# Patient Record
Sex: Male | Born: 1985 | Race: White | Hispanic: No | Smoking: Never smoker
Health system: Southern US, Community
[De-identification: ages and names within clinical notes are randomized; demographics above are authoritative.]

## PROBLEM LIST (undated history)

## (undated) DIAGNOSIS — M549 Dorsalgia, unspecified: Secondary | ICD-10-CM

## (undated) DIAGNOSIS — K219 Gastro-esophageal reflux disease without esophagitis: Secondary | ICD-10-CM

## (undated) DIAGNOSIS — M2550 Pain in unspecified joint: Secondary | ICD-10-CM

## (undated) DIAGNOSIS — E785 Hyperlipidemia, unspecified: Secondary | ICD-10-CM

## (undated) DIAGNOSIS — E7800 Pure hypercholesterolemia, unspecified: Secondary | ICD-10-CM

## (undated) DIAGNOSIS — R7303 Prediabetes: Secondary | ICD-10-CM

## (undated) DIAGNOSIS — T7840XA Allergy, unspecified, initial encounter: Secondary | ICD-10-CM

## (undated) DIAGNOSIS — F419 Anxiety disorder, unspecified: Secondary | ICD-10-CM

## (undated) DIAGNOSIS — I10 Essential (primary) hypertension: Secondary | ICD-10-CM

## (undated) DIAGNOSIS — E669 Obesity, unspecified: Secondary | ICD-10-CM

## (undated) DIAGNOSIS — R0602 Shortness of breath: Secondary | ICD-10-CM

## (undated) DIAGNOSIS — F988 Other specified behavioral and emotional disorders with onset usually occurring in childhood and adolescence: Secondary | ICD-10-CM

## (undated) HISTORY — DX: Pure hypercholesterolemia, unspecified: E78.00

## (undated) HISTORY — DX: Essential (primary) hypertension: I10

## (undated) HISTORY — DX: Other specified behavioral and emotional disorders with onset usually occurring in childhood and adolescence: F98.8

## (undated) HISTORY — DX: Allergy, unspecified, initial encounter: T78.40XA

## (undated) HISTORY — DX: Anxiety disorder, unspecified: F41.9

## (undated) HISTORY — DX: Dorsalgia, unspecified: M54.9

## (undated) HISTORY — PX: KNEE SURGERY: SHX244

## (undated) HISTORY — DX: Gastro-esophageal reflux disease without esophagitis: K21.9

## (undated) HISTORY — DX: Obesity, unspecified: E66.9

## (undated) HISTORY — DX: Prediabetes: R73.03

## (undated) HISTORY — PX: WRIST FRACTURE SURGERY: SHX121

## (undated) HISTORY — DX: Hyperlipidemia, unspecified: E78.5

## (undated) HISTORY — DX: Pain in unspecified joint: M25.50

## (undated) HISTORY — DX: Shortness of breath: R06.02

---
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
## 2003-07-23 ENCOUNTER — Emergency Department (HOSPITAL_COMMUNITY): Admission: EM | Admit: 2003-07-23 | Discharge: 2003-07-23 | Payer: Self-pay | Admitting: Emergency Medicine

## 2003-08-17 ENCOUNTER — Ambulatory Visit (HOSPITAL_COMMUNITY): Admission: RE | Admit: 2003-08-17 | Discharge: 2003-08-18 | Payer: Self-pay | Admitting: Orthopedic Surgery

## 2007-10-10 ENCOUNTER — Encounter: Admission: RE | Admit: 2007-10-10 | Discharge: 2007-10-10 | Payer: Self-pay | Admitting: Family Medicine

## 2010-02-23 ENCOUNTER — Encounter: Admission: RE | Admit: 2010-02-23 | Discharge: 2010-02-23 | Payer: Self-pay | Admitting: Family Medicine

## 2011-02-09 NOTE — Op Note (Signed)
NAME:  Alexander Solomon, Alexander Solomon NO.:  [id number]   MEDICAL RECORD NO.:  [id number]                   PATIENT TYPE:  OIB   LOCATION:  5017                                 FACILITY:  [facility]   PHYSICIAN:  Burnard Bunting, M.D.                 DATE OF BIRTH:  Dec 04, 1985   DATE OF PROCEDURE:  08/17/2003  DATE OF DISCHARGE:  08/18/2003                                 OPERATIVE REPORT   PREOPERATIVE DIAGNOSIS:  Right knee osteochondral fracture and patellar  dislocation.   POSTOPERATIVE DIAGNOSIS:  Right knee osteochondral fracture and patellar  dislocation.   PROCEDURES:  1. Right knee diagnostic and operative arthroscopy.  2. Right knee removal of loose bodies x3, each one 6 x 6 mm.  3. Intra-articular debridement of synovitis.  4. Open medial patellofemoral ligament repair.  5. Arthroscopic lateral retinacular release.   SURGEON:  Burnard Bunting, M.D.   ASSISTANT:  __________   ANESTHESIA:  General endotracheal plus postoperative femoral nerve block.   ESTIMATED BLOOD LOSS:  25 mL.   DRAINS:  None.   TOURNIQUET TIME:  Two hours at 300 mmHg.   OPERATIVE FINDINGS:  1. Examination under anesthesia:  Significant lateral instability of the     patella, the three quadrants lateral, and one and a half quadrants     medial.     a. Intact anterior cruciate ligament and posterior cruciate ligament.     b. No postoperative rotary instability.     c. Intact medial collateral ligament and lateral collateral ligament.  2. Diagnostic and operative arthroscopy.     a. Osteochondral fracture with loose bodies x3 each measuring 6 x 6 mm.        Loose bodies were located in the lateral gutter and were not on the        weightbearing surface of the articular cartilage.     b. Generalized synovitis in the suprapatellar pouch.     c. Intact anterior cruciate ligament and posterior cruciate ligament.     d. Intact medial compartment.     e. 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   PROCEDURE IN DETAIL:  The patient was brought to the operating room where  general endotracheal anesthesia was induced.  Preoperative IV antibiotics  were administered.  The right leg and foot was prepped using DuraPrep  solution and draped in a sterile manner.  A proximal right thigh tourniquet  was placed  The left was elevated, exsanguinated with the Esmarch wrap, and  the tourniquet was inflated  The anatomy of the knee was identified  including the medial, lateral, and inferior margin of the patella as well as  the patellar tendon and the joint line both on the medial and lateral sides.  Anterior-inferior lateral portal was first established. Diagnostic  arthroscopy  was performed.  Medial compartment was intact with intact and  stable meniscus, intact articular cartilage.  The ACL and PCL was intact.  Lateral compartment showed some early grade 1 chondromalacia along the  lateral tibial plateau.  The patella was noted to have a significant lateral  tilt.  Loose bodies were present in the lateral gutter, and these were  consistent with osteochondral shear fractures from the patellar dislocation.  The medial facet of the patella was relatively spared.  These loose bodies  were removed after creation of an anterior superolateral portal.  Debridement was then performed of inflamed synovitic tissue in the  suprapatellar pouch region.  Excessive bleeding was not created.  Following  removal of the loose bodies, the lateral retinacular release was performed  under arthroscopic visualization.  Following lateral release, the patella  was tilted approximately 60 degrees which was adequate when combined with  the medial reefing procedure.  At this time, the instruments were removed  from their portals which were then closed using 3-0 nylon suture.  Alexander Solomon was  then used to cover the knee.  An incision was made between the medial  aspect  of the patella and the medial epicondyle.  Skin and subcutaneous tissue were  sharply divided.  Branches were divided and protected when possible.  A  sleeve of tissue was then elevated off the medial aspect of the patella  beginning at the expansion of the patellar tendon distally and extending  proximally up to include part of the VMO attachment.  A sleeve of soft  tissue was removed.  Dissection was performed to separate the capsule from  the underlying medial patellofemoral ligament.  This was performed.  The  capsule was torn from its attachment off the medial epicondyle.  The medial  patellofemoral ligament had also been stretched.  The VMO fascia was  repaired back to the intermuscular septum using an inverted interrupted 0  Vicryl suture.  A 5 mm suture anchor was then placed in the medial  epicondyle to provide an anchoring location for both the capsule and the  medial patellofemoral ligament.  Both ends of the sutures were then brought  through the capsule and the epicondylar attachment of the medial  patellofemoral ligament.  Secure anchoring was achieved.  At this time, the  capsule and medial patellofemoral ligament was advanced on the patella using  a combination of #2 Fibrewire suture and Curve Tech device.  An imbrication  of the elongated medial retinacular restraint was achieved.  This suture was  reapproximated to the patella in about 40 degrees of flexion with the  patella well located in the groove.  The patient's knee was taken through a  range of motion, and the patient was found to have full range of motion.  The retinacular repair was reinforced with 0 Vicryl sutures.  The bony  trough was created in the exposed portion of the patellar facet prior to  plication of the medial retinacular restraints.  Following suture fixation,  the tourniquet was released.  Bleeding points encountered were controlled using electrocautery.  Skin and subcutaneous tissue was  then closed using  interrupted inverted 0 Vicryl, 2-0 Vicryl, and a 3-0 Prolene suture.  A  solution of morphine, Marcaine, and clonidine was then injected into the  knee.  The patient was placed in a bulky dressing and knee immobilizer.  He  tolerated the procedure well without immediate complications.  Burnard Bunting, M.D.    GSD/MEDQ  D:  08/18/2003  T:  08/18/2003  Job:  [id number]

## 2015-03-03 ENCOUNTER — Ambulatory Visit: Payer: Self-pay | Admitting: Cardiology

## 2015-05-04 ENCOUNTER — Encounter: Payer: Self-pay | Admitting: Cardiology

## 2015-05-04 ENCOUNTER — Ambulatory Visit (INDEPENDENT_AMBULATORY_CARE_PROVIDER_SITE_OTHER): Payer: PRIVATE HEALTH INSURANCE | Admitting: Cardiology

## 2015-05-04 VITALS — BP 110/72 | HR 76 | Ht 71.0 in | Wt 352.0 lb

## 2015-05-04 DIAGNOSIS — R002 Palpitations: Secondary | ICD-10-CM

## 2015-05-04 DIAGNOSIS — I493 Ventricular premature depolarization: Secondary | ICD-10-CM

## 2015-05-04 DIAGNOSIS — E785 Hyperlipidemia, unspecified: Secondary | ICD-10-CM

## 2015-05-04 DIAGNOSIS — R079 Chest pain, unspecified: Secondary | ICD-10-CM | POA: Diagnosis not present

## 2015-05-04 DIAGNOSIS — I10 Essential (primary) hypertension: Secondary | ICD-10-CM

## 2015-05-04 DIAGNOSIS — R0683 Snoring: Secondary | ICD-10-CM

## 2015-05-04 DIAGNOSIS — G4733 Obstructive sleep apnea (adult) (pediatric): Secondary | ICD-10-CM

## 2015-05-04 DIAGNOSIS — R0602 Shortness of breath: Secondary | ICD-10-CM

## 2015-05-04 DIAGNOSIS — G4719 Other hypersomnia: Secondary | ICD-10-CM

## 2015-05-04 HISTORY — DX: Obstructive sleep apnea (adult) (pediatric): G47.33

## 2015-05-04 HISTORY — DX: Other hypersomnia: G47.19

## 2015-05-04 HISTORY — DX: Ventricular premature depolarization: I49.3

## 2015-05-04 HISTORY — DX: Essential (primary) hypertension: I10

## 2015-05-04 HISTORY — DX: Chest pain, unspecified: R07.9

## 2015-05-04 HISTORY — DX: Hyperlipidemia, unspecified: E78.5

## 2015-05-04 NOTE — Progress Notes (Signed)
Cardiology Office Note   Date:  05/04/2015   ID:  Alexander Solomon, DOB 1986-07-09, MRN [id number]  PCP:  Lupita Raider, MD    Chief Complaint  Patient presents with  . New Evaluation    Palpitations      History of Present Illness: Alexander Solomon is a 30 y.o. male who presents for evaluation of chest fluttering.  He recently saw his PCP for followup of HTN, dyslipidemia and obesity.  He has been having intermittent fluttering on the left side of his chest that he only notices when at rest or lying on his left side.  It lasts and few seconds and spontaneously resolves.  He denies any nausea, LE edema, dizziness or syncope.  EKG showed NSR with PVC in PCP office. He has also been having some episodes of chest discomfort that is very hard for him to describe.  It is in the upper left chest and into his shoulder.  It can last for a few hours a day but is not associated with nausea or diaphoresis.  He is very concerned because his dad died after having 3 open heart surgeries for CAD.  His father was a smoker and drinker.  He has some DOE but is very obese.  He says that he snores and feels tired all day.    Past Medical History  Diagnosis Date  . Hypertension   . Hyperlipidemia   . ADD (attention deficit disorder)   . GERD (gastroesophageal reflux disease)   . Obesity     History reviewed. No pertinent past surgical history.   Current Outpatient Prescriptions  Medication Sig Dispense Refill  . buPROPion (WELLBUTRIN SR) 150 MG 12 hr tablet Take 150 mg by mouth daily.    . cyclobenzaprine (FLEXERIL) 10 MG tablet Take 10 mg by mouth 3 (three) times daily as needed for muscle spasms.    . diclofenac (VOLTAREN) 75 MG EC tablet Take 75 mg by mouth 2 (two) times daily.    Marland Kitchen loratadine-pseudoephedrine (CLARITIN-D 24-HOUR) 10-240 MG per 24 hr tablet Take 1 tablet by mouth daily.    Marland Kitchen losartan-hydrochlorothiazide (HYZAAR) 100-25 MG per tablet Take 1 tablet by mouth daily.      . mometasone (ELOCON) 0.1 % cream Apply 1 application topically 2 (two) times daily as needed (FOR EXCEMA OR RASH PATCHES).    . Multiple Vitamin (MULTIVITAMIN) tablet Take 1 tablet by mouth daily.    . Omega-3 Fatty Acids (FISH OIL) 1200 MG CAPS Take 1,200 mg by mouth daily.    Marland Kitchen omeprazole (PRILOSEC) 40 MG capsule Take 40 mg by mouth daily.    . simvastatin (ZOCOR) 40 MG tablet Take 40 mg by mouth daily.     No current facility-administered medications for this visit.    Allergies:   Review of patient's allergies indicates no known allergies.    Social History:  The patient  reports that he has never smoked. He does not have any smokeless tobacco history on file. He reports that he does not drink alcohol or use illicit drugs.   Family History:  The patient's family history is not on file.    ROS:  Please see the history of present illness.   Otherwise, review of systems are positive for none.   All other systems are reviewed and negative.    PHYSICAL EXAM: VS:  Ht 5\' 11"  (1.803 m)  Wt 352 lb (  159.666 kg)  BMI 49.12 kg/m2 , BMI Body mass index is 49.12 kg/(m^2). GEN: Well nourished, well developed, in no acute distress HEENT: normal Neck: no JVD, carotid bruits, or masses Cardiac: RRR; no murmurs, rubs, or gallops,no edema  Respiratory:  clear to auscultation bilaterally, normal work of breathing GI: soft, nontender, nondistended, + BS MS: no deformity or atrophy Skin: warm and dry, no rash Neuro:  Strength and sensation are intact Psych: euthymic mood, full affect   EKG:  EKG is ordered today. The ekg ordered today demonstrates NSR with no ST changes   Recent Labs: No results found for requested labs within last 365 days.    Lipid Panel No results found for: CHOL, TRIG, HDL, CHOLHDL, VLDL, LDLCALC, LDLDIRECT    Wt Readings from Last 3 Encounters:  05/04/15 352 lb (159.666 kg)        ASSESSMENT AND PLAN:  1.  Palpitations - EKG in PCP office showed a PVC.   I will get a 30 day event monitor to assess further 2.  HTN - controlled on Hyzaar 3.  Obesity 4.  Atypical chest pain with no ischemia on EKG.  He does have risk factors including family history, obesity, HTN and dyslipidemia.  I will get a baseline ETT. 5.  SOB that is most likely due to obesity.  Check 2D echo to assess LVF. 6.  Excessive daytime sleepiness with snoring.  I suspect he has OSA which may be triggering his palpitations.  I will get a home sleep study.   Current medicines are reviewed at length with the patient today.  The patient does not have concerns regarding medicines.  The following changes have been made:  no change  Labs/ tests ordered today: See above Assessment and Plan No orders of the defined types were placed in this encounter.     Disposition:   FU with me PRN pending results of heart monitor  Signed, Quintella Reichert, MD  05/04/2015 9:34 AM    [facility] [address] [place], [place], [place]  [address] Phone: [phone]; Fax: [phone]

## 2015-05-04 NOTE — Patient Instructions (Signed)
Medication Instructions:  Your physician recommends that you continue on your current medications as directed. Please refer to the Current Medication list given to you today.   Labwork: None  Testing/Procedures: Your physician has requested that you have an echocardiogram. Echocardiography is a painless test that uses sound waves to create images of your heart. It provides your doctor with information about the size and shape of your heart and how well your heart's chambers and valves are working. This procedure takes approximately one hour. There are no restrictions for this procedure.   Your physician has requested that you have an exercise tolerance test. For further information please visit [email or web address] Please also follow instruction sheet, as given.  Your physician has recommended that you wear an event monitor. Event monitors are medical devices that record the heart's electrical activity. Doctors most often [place] these monitors to diagnose arrhythmias. Arrhythmias are problems with the speed or rhythm of the heartbeat. The monitor is a small, portable device. You can wear one while you do your normal daily activities. This is usually used to diagnose what is causing palpitations/syncope (passing out).  Dr. Mayford Knife recommends you have a HOME SLEEP STUDY. Bethany, CMA, will call you to schedule this when pre-certification is complete.  Follow-Up: Your physician recommends that you schedule a follow-up appointment AS NEEDED with Dr. Mayford Knife pending your study results.  Any Other Special Instructions Will Be Listed Below (If Applicable).

## 2015-05-13 ENCOUNTER — Telehealth (HOSPITAL_COMMUNITY): Payer: Self-pay

## 2015-05-13 NOTE — Telephone Encounter (Signed)
Encounter complete. 

## 2015-05-16 ENCOUNTER — Ambulatory Visit (HOSPITAL_COMMUNITY): Payer: 59 | Attending: Internal Medicine

## 2015-05-16 ENCOUNTER — Other Ambulatory Visit: Payer: Self-pay

## 2015-05-16 ENCOUNTER — Ambulatory Visit (INDEPENDENT_AMBULATORY_CARE_PROVIDER_SITE_OTHER): Payer: PRIVATE HEALTH INSURANCE

## 2015-05-16 DIAGNOSIS — Z8249 Family history of ischemic heart disease and other diseases of the circulatory system: Secondary | ICD-10-CM | POA: Insufficient documentation

## 2015-05-16 DIAGNOSIS — I10 Essential (primary) hypertension: Secondary | ICD-10-CM | POA: Insufficient documentation

## 2015-05-16 DIAGNOSIS — R002 Palpitations: Secondary | ICD-10-CM | POA: Diagnosis not present

## 2015-05-16 DIAGNOSIS — E785 Hyperlipidemia, unspecified: Secondary | ICD-10-CM | POA: Diagnosis not present

## 2015-05-16 DIAGNOSIS — R0602 Shortness of breath: Secondary | ICD-10-CM | POA: Diagnosis not present

## 2015-05-16 DIAGNOSIS — Z6841 Body Mass Index (BMI) 40.0 and over, adult: Secondary | ICD-10-CM | POA: Diagnosis not present

## 2015-05-18 ENCOUNTER — Ambulatory Visit (HOSPITAL_COMMUNITY)
Admission: RE | Admit: 2015-05-18 | Discharge: 2015-05-18 | Disposition: A | Discharge status: Home / Self Care | Payer: 59 | Source: Ambulatory Visit | Attending: Cardiology | Admitting: Cardiology

## 2015-05-18 DIAGNOSIS — R079 Chest pain, unspecified: Secondary | ICD-10-CM | POA: Diagnosis not present

## 2015-05-18 LAB — EXERCISE TOLERANCE TEST
Estimated workload: 10.3 METS
Exercise duration (min): 8 min
Exercise duration (sec): 22 s
MPHR: 189 {beats}/min
Peak HR: 176 {beats}/min
Percent HR: 93 %
RPE: 16
Rest HR: 85 {beats}/min

## 2015-06-02 ENCOUNTER — Encounter: Payer: Self-pay | Admitting: Cardiology

## 2015-06-02 ENCOUNTER — Telehealth: Payer: Self-pay | Admitting: Cardiology

## 2015-06-02 DIAGNOSIS — G4733 Obstructive sleep apnea (adult) (pediatric): Secondary | ICD-10-CM

## 2015-06-02 NOTE — Telephone Encounter (Signed)
Please let patient know that he has mild OSA with AHI 13/hr.  Given his snoring and excessive daytime sleepiness recommend in lab CPAP titration.  Please set up

## 2015-06-02 NOTE — Telephone Encounter (Signed)
Left message to call back  

## 2015-06-02 NOTE — Telephone Encounter (Signed)
This encounter was created in error - please disregard.

## 2015-06-03 ENCOUNTER — Encounter: Payer: Self-pay | Admitting: *Deleted

## 2015-06-03 NOTE — Addendum Note (Signed)
Addended by: Arcola Jansky on: 06/03/2015 08:44 AM   Modules accepted: Orders

## 2015-06-03 NOTE — Telephone Encounter (Signed)
Patient is aware of results. Okay to proceed.   Would like a call after precert just to know that it is covered, and what his cost might end up being.

## 2015-06-03 NOTE — Telephone Encounter (Signed)
It depends on his plan and benefits how much he would end up being responsible for.  He would need to call his insurance and give them the CPT code 40981 and also that it will be done at [facility] Tax ID [id number].   We will make sure his sleep study is approved if it is required before the services are rendered.

## 2015-06-15 ENCOUNTER — Telehealth: Payer: Self-pay | Admitting: Cardiology

## 2015-06-15 NOTE — Telephone Encounter (Signed)
Home sleep study showed mild OSA.  Given history of palpitations, snoring and excessive daytime sleepiness - please set up for in lab CPAP titration

## 2015-06-16 NOTE — Telephone Encounter (Signed)
Patient is scheduled for Titration 07/06/15

## 2015-07-06 ENCOUNTER — Encounter (HOSPITAL_BASED_OUTPATIENT_CLINIC_OR_DEPARTMENT_OTHER): Payer: PRIVATE HEALTH INSURANCE

## 2015-09-07 ENCOUNTER — Other Ambulatory Visit: Payer: Self-pay | Admitting: Internal Medicine

## 2015-09-07 ENCOUNTER — Ambulatory Visit (INDEPENDENT_AMBULATORY_CARE_PROVIDER_SITE_OTHER): Payer: 59 | Admitting: Internal Medicine

## 2015-09-07 ENCOUNTER — Encounter: Payer: Self-pay | Admitting: Internal Medicine

## 2015-09-07 DIAGNOSIS — J302 Other seasonal allergic rhinitis: Secondary | ICD-10-CM

## 2015-09-07 DIAGNOSIS — F909 Attention-deficit hyperactivity disorder, unspecified type: Secondary | ICD-10-CM

## 2015-09-07 DIAGNOSIS — F988 Other specified behavioral and emotional disorders with onset usually occurring in childhood and adolescence: Secondary | ICD-10-CM

## 2015-09-07 DIAGNOSIS — K219 Gastro-esophageal reflux disease without esophagitis: Secondary | ICD-10-CM

## 2015-09-07 DIAGNOSIS — Z7251 High risk heterosexual behavior: Secondary | ICD-10-CM | POA: Insufficient documentation

## 2015-09-07 DIAGNOSIS — F419 Anxiety disorder, unspecified: Secondary | ICD-10-CM

## 2015-09-07 HISTORY — DX: High risk heterosexual behavior: Z72.51

## 2015-09-07 HISTORY — DX: Other seasonal allergic rhinitis: J30.2

## 2015-09-07 HISTORY — DX: Other specified behavioral and emotional disorders with onset usually occurring in childhood and adolescence: F98.8

## 2015-09-07 HISTORY — DX: Morbid (severe) obesity due to excess calories: E66.01

## 2015-09-07 MED ORDER — EMTRICITABINE-TENOFOVIR DF 200-300 MG PO TABS: 1.0000 | ORAL_TABLET | Freq: Every day | ORAL | Status: DC

## 2015-09-07 NOTE — Progress Notes (Signed)
[facility]  Reason for Consult: Consideration for HIV previously exposure prophylaxis Referring Physician: Dr. Derrill Memo  Patient Active Problem List   Diagnosis Date Noted  . High risk sexual behavior 09/07/2015    Priority: High  . Morbid obesity ([place]) 09/07/2015  . GERD (gastroesophageal reflux disease) 09/07/2015  . Seasonal allergies 09/07/2015  . Attention deficit disorder 09/07/2015  . Anxiety 09/07/2015  . PVC (premature ventricular contraction) 05/04/2015  . Benign essential HTN 05/04/2015  . Hyperlipidemia 05/04/2015  . Obstructive sleep apnea 05/04/2015    Patient's Medications  New Prescriptions   No medications on file  Previous Medications   BUPROPION (WELLBUTRIN SR) 150 MG 12 HR TABLET    Take 150 mg by mouth daily.   CYCLOBENZAPRINE (FLEXERIL) 10 MG TABLET    Take 10 mg by mouth 3 (three) times daily as needed for muscle spasms. Reported on 09/07/2015   DICLOFENAC (VOLTAREN) 75 MG EC TABLET    Take 75 mg by mouth 2 (two) times daily. Reported on 09/07/2015   LORATADINE-PSEUDOEPHEDRINE (CLARITIN-D 24-HOUR) 10-240 MG PER 24 HR TABLET    Take 1 tablet by mouth daily.   LOSARTAN-HYDROCHLOROTHIAZIDE (HYZAAR) 100-25 MG PER TABLET    Take 1 tablet by mouth daily.   MOMETASONE (ELOCON) 0.1 % CREAM    Apply 1 application topically 2 (two) times daily as needed (FOR EXCEMA OR RASH PATCHES).   MULTIPLE VITAMIN (MULTIVITAMIN) TABLET    Take 1 tablet by mouth daily.   OMEGA-3 FATTY ACIDS (FISH OIL) 1200 MG CAPS    Take 1,200 mg by mouth daily. Reported on 09/07/2015   OMEPRAZOLE (PRILOSEC) 40 MG CAPSULE    Take 40 mg by mouth daily.   SIMVASTATIN (ZOCOR) 40 MG TABLET    Take 40 mg by mouth daily.  Modified Medications   No medications on file  Discontinued Medications   No medications on file    Recommendations: 1. HIV prevention in education counseling provided 2. Repeat HIV testing 3. Hepatitis A and hepatitis B antibody  testing 4. Urinalysis 5. Truvada 1 daily 6. Follow-up in 3 months   Assessment: I reviewed the relative risk of HIV transmission with various forms of sexual activity and gave him basic education on HIV prevention including careful partners collection, limiting the number of partners and consistent and cracked condom use. In addition to that he is a candidate for preexposure prophylaxis PreP with once daily Truvada. I went over the relative risk and benefits of this. He has no problems taking his other medications and does not feel like adherence will be an issue. I will check repeat HIV antibody and also check his hepatitis A and B serologies today and plan on seeing him back in 3 months.  HPI: Alexander Solomon is a 29 y.o. male who states that he has always known that he was gay but was raised in a very religious family and suppressed his sexuality. He married and had a son but states that the last 4 years of his marriage have been very rocky. He is now separated from his wife. He has had 5 lifetime male sexual contacts, all in the last 4 years. He has performed unprotected oral sex. He's only had one episode of anal receptive intercourse and his partner did use a condom. He has never had any known sexual transmitted disease. He tested negative for HIV, syphilis, chlamydia and gonorrhea in August of this year. His last  sexual contact was in October. He has met his partners on various Internet websites. He has no illicit drug use. He states that he is quite "paranoid" about getting HIV or any other sexually transmitted disease.  Review of Systems: Review of Systems  Constitutional: Negative for fever, chills, weight loss, malaise/fatigue and diaphoresis.  HENT: Negative for sore throat.        He has noted some soreness on the left side of his tongue for the past few months and was worried that that might be a sign of a sexually transmitted disease.  Respiratory: Negative for cough, sputum production  and shortness of breath.   Cardiovascular: Negative for chest pain.  Gastrointestinal: Negative for nausea, vomiting and diarrhea.  Genitourinary: Negative for dysuria and frequency.  Skin: Negative for rash.  Psychiatric/Behavioral: Negative for depression and substance abuse. The patient is not nervous/anxious.       Past Medical History  Diagnosis Date  . Hypertension   . Hyperlipidemia   . ADD (attention deficit disorder)   . GERD (gastroesophageal reflux disease)   . Obesity   . Anxiety   . Allergy     Social History  Substance Use Topics  . Smoking status: Never Smoker   . Smokeless tobacco: Never Used  . Alcohol Use: No    Family History  Problem Relation Age of Onset  . Hypertension Mother   . Diabetes Mother   . Heart disease Father   . Heart attack Father    No Known Allergies  OBJECTIVE: Filed Vitals:   09/07/15 1412  BP: 125/81  Pulse: 98  Temp: 98.2 F (36.8 C)  Height: '5\' 11"'$  (1.803 m)  Weight: 349 lb 12.8 oz (158.668 kg)   Body mass index is 48.81 kg/(m^2).   Physical Exam  Constitutional: He is oriented to person, place, and time.  He is well dressed and in no distress. He is slightly nervous.  HENT:  Mouth/Throat: No oropharyngeal exudate.  No abnormality of his tongue or other oropharyngeal lesions noted.  Cardiovascular: Normal rate and regular rhythm.   No murmur heard. Pulmonary/Chest: Breath sounds normal.  Abdominal: Soft. There is no tenderness.  Lymphadenopathy:       Head (right side): No submandibular adenopathy present.       Head (left side): No submandibular adenopathy present.    He has no cervical adenopathy.    He has no axillary adenopathy.  Neurological: He is alert and oriented to person, place, and time.  Skin: No rash noted.  Psychiatric: Mood and affect normal.    Microbiology: No results found for this or any previous visit (from the past 240 hour(s)).  Michel Bickers, MD [facility] [phone] pager   [phone] cell 09/07/2015, 3:02 PM

## 2015-09-07 NOTE — Assessment & Plan Note (Signed)
I reviewed the relative risk of HIV transmission with various forms of sexual activity and gave him basic education on HIV prevention including careful partners collection, limiting the number of partners and consistent and cracked condom use. In addition to that he is a candidate for preexposure prophylaxis PreP with once daily Truvada. I went over the relative risk and benefits of this. He has no problems taking his other medications and does not feel like adherence will be an issue. I will check repeat HIV antibody and also check his hepatitis A and B serologies today and plan on seeing him back in 3 months.

## 2015-09-08 LAB — URINALYSIS
Bilirubin Urine: NEGATIVE
Glucose, UA: NEGATIVE
Hgb urine dipstick: NEGATIVE
Ketones, ur: NEGATIVE
Leukocytes, UA: NEGATIVE
Nitrite: NEGATIVE
Protein, ur: NEGATIVE
Specific Gravity, Urine: 1.018 (ref 1.001–1.035)
pH: 6 (ref 5.0–8.0)

## 2015-09-08 LAB — HEPATITIS A ANTIBODY, TOTAL: Hep A Total Ab: NONREACTIVE

## 2015-09-08 LAB — HEPATITIS B SURFACE ANTIGEN: Hepatitis B Surface Ag: NEGATIVE

## 2015-09-08 LAB — HEPATITIS B SURFACE ANTIBODY,QUALITATIVE: Hep B S Ab: NEGATIVE

## 2015-09-09 ENCOUNTER — Telehealth: Payer: Self-pay | Admitting: *Deleted

## 2015-09-09 NOTE — Telephone Encounter (Signed)
Patient called for the results of his lab work. Noticed in the office note HIV was suppose to be done and it was not ordered. Lab needs to know exactly which tests need to be added on. Wendall MolaJacqueline Cockerham

## 2015-09-09 NOTE — Telephone Encounter (Signed)
He needs an HIV Ab drawn. If it cannot be added to recent labs please ask him to come back in next week and give him my apologies. Thanks.

## 2015-09-12 ENCOUNTER — Other Ambulatory Visit: Payer: Self-pay | Admitting: Internal Medicine

## 2015-09-12 DIAGNOSIS — Z21 Asymptomatic human immunodeficiency virus [HIV] infection status: Secondary | ICD-10-CM

## 2015-09-12 LAB — HIV ANTIBODY (ROUTINE TESTING W REFLEX): HIV 1&2 Ab, 4th Generation: NONREACTIVE

## 2015-09-12 NOTE — Telephone Encounter (Signed)
This has been added 

## 2015-10-11 ENCOUNTER — Encounter: Payer: Self-pay | Admitting: Internal Medicine

## 2015-10-12 ENCOUNTER — Other Ambulatory Visit: Payer: Self-pay | Admitting: *Deleted

## 2015-10-12 DIAGNOSIS — Z7251 High risk heterosexual behavior: Secondary | ICD-10-CM

## 2015-10-12 MED ORDER — EMTRICITABINE-TENOFOVIR DF 200-300 MG PO TABS: 1.0000 | ORAL_TABLET | Freq: Every day | ORAL | Status: DC

## 2015-10-14 ENCOUNTER — Other Ambulatory Visit: Payer: Self-pay | Admitting: *Deleted

## 2015-10-14 DIAGNOSIS — Z7251 High risk heterosexual behavior: Secondary | ICD-10-CM

## 2015-10-14 MED ORDER — EMTRICITABINE-TENOFOVIR DF 200-300 MG PO TABS: 1.0000 | ORAL_TABLET | Freq: Every day | ORAL | Status: DC

## 2015-12-06 ENCOUNTER — Other Ambulatory Visit: Payer: Self-pay | Admitting: *Deleted

## 2015-12-06 ENCOUNTER — Encounter: Payer: Self-pay | Admitting: Internal Medicine

## 2015-12-06 ENCOUNTER — Ambulatory Visit (INDEPENDENT_AMBULATORY_CARE_PROVIDER_SITE_OTHER): Payer: No Typology Code available for payment source | Admitting: Internal Medicine

## 2015-12-06 VITALS — BP 127/83 | HR 85 | Temp 98.1°F | Ht 71.0 in | Wt 336.0 lb

## 2015-12-06 DIAGNOSIS — Z7251 High risk heterosexual behavior: Secondary | ICD-10-CM

## 2015-12-06 DIAGNOSIS — Z21 Asymptomatic human immunodeficiency virus [HIV] infection status: Secondary | ICD-10-CM

## 2015-12-06 DIAGNOSIS — Z23 Encounter for immunization: Secondary | ICD-10-CM | POA: Diagnosis not present

## 2015-12-06 LAB — COMPREHENSIVE METABOLIC PANEL
ALT: 46 U/L (ref 9–46)
AST: 22 U/L (ref 10–40)
Albumin: 4.2 g/dL (ref 3.6–5.1)
Alkaline Phosphatase: 63 U/L (ref 40–115)
BUN: 16 mg/dL (ref 7–25)
CO2: 28 mmol/L (ref 20–31)
Calcium: 9.9 mg/dL (ref 8.6–10.3)
Chloride: 106 mmol/L (ref 98–110)
Creat: 0.88 mg/dL (ref 0.60–1.35)
Glucose, Bld: 93 mg/dL (ref 65–99)
Potassium: 4.5 mmol/L (ref 3.5–5.3)
Sodium: 140 mmol/L (ref 135–146)
Total Bilirubin: 0.4 mg/dL (ref 0.2–1.2)
Total Protein: 7.4 g/dL (ref 6.1–8.1)

## 2015-12-06 MED ORDER — EMTRICITABINE-TENOFOVIR DF 200-300 MG PO TABS: 1.0000 | ORAL_TABLET | Freq: Every day | ORAL | Status: DC

## 2015-12-06 NOTE — Assessment & Plan Note (Signed)
He is tolerating Truvada PrEP well and not missing doses. I did talk to him again about the importance of limiting the number of partners he has and very careful partner selection and condom use. Truvada will reduce his risk of acquiring HIV infection but has no impact on other sexually transmitted diseases. He received his initial doses of hepatitis A and B vaccines today. He will get repeat lab work today and continue Truvada. He will follow-up in 3 months.

## 2015-12-06 NOTE — Progress Notes (Signed)
[name]  Patient Active Problem List   Diagnosis Date Noted  . High risk sexual behavior 09/07/2015    Priority: High  . Morbid obesity ([place]) 09/07/2015  . GERD (gastroesophageal reflux disease) 09/07/2015  . Seasonal allergies 09/07/2015  . Attention deficit disorder 09/07/2015  . Anxiety 09/07/2015  . PVC (premature ventricular contraction) 05/04/2015  . Benign essential HTN 05/04/2015  . Hyperlipidemia 05/04/2015  . Obstructive sleep apnea 05/04/2015    Patient's Medications  New Prescriptions   No medications on file  Previous Medications   BUPROPION (WELLBUTRIN SR) 150 MG 12 HR TABLET    Take 300 mg by mouth daily.    CYCLOBENZAPRINE (FLEXERIL) 10 MG TABLET    Take 10 mg by mouth 3 (three) times daily as needed for muscle spasms. Reported on 09/07/2015   DICLOFENAC (VOLTAREN) 75 MG EC TABLET    Take 75 mg by mouth 2 (two) times daily. Reported on 09/07/2015   LORATADINE-PSEUDOEPHEDRINE (CLARITIN-D 24-HOUR) 10-240 MG PER 24 HR TABLET    Take 1 tablet by mouth daily.   LOSARTAN-HYDROCHLOROTHIAZIDE (HYZAAR) 100-25 MG PER TABLET    Take 1 tablet by mouth daily.   MOMETASONE (ELOCON) 0.1 % CREAM    Apply 1 application topically 2 (two) times daily as needed (FOR EXCEMA OR RASH PATCHES).   MULTIPLE VITAMIN (MULTIVITAMIN) TABLET    Take 1 tablet by mouth daily.   OMEGA-3 FATTY ACIDS (FISH OIL) 1200 MG CAPS    Take 1,200 mg by mouth daily. Reported on 09/07/2015   OMEPRAZOLE (PRILOSEC) 40 MG CAPSULE    Take 40 mg by mouth daily.   SIMVASTATIN (ZOCOR) 40 MG TABLET    Take 40 mg by mouth daily.  Modified Medications   Modified Medication Previous Medication   EMTRICITABINE-TENOFOVIR (TRUVADA) 200-300 MG TABLET emtricitabine-tenofovir (TRUVADA) 200-300 MG tablet      Take 1 tablet by mouth daily.    Take 1 tablet by mouth daily.  Discontinued Medications   No medications on file    Subjective: Alexander Solomon is in for his routine follow-up visit.  He started preexposure prophylaxis with Truvada almost 3 months ago. He has had no problems tolerating it. He takes it each morning with breakfast and does not recall missing any doses. He has had 3 male sexual contacts since his last visit all met on [employer]. Condoms were used for to the encounters but not the other. He developed a bad sore throat when he was on a business trip to [facility]. He went to an urgent care center. A rapid strep test was negative but he was treated with penicillin anyway. He developed a stomach flu like illness after he returned at that has resolved. He believes he had some low-grade fever during those illnesses. He has not had any rash. He feels completely well now.  Review of Systems: Review of Systems  Constitutional: Negative for fever, chills, weight loss, malaise/fatigue and diaphoresis.  HENT: Negative for sore throat.        No mouth sores.  Respiratory: Negative for cough, sputum production and shortness of breath.   Cardiovascular: Negative for chest pain.  Gastrointestinal: Negative for nausea, vomiting, abdominal pain and diarrhea.  Genitourinary: Negative for dysuria and frequency.       No genital lesions.  Musculoskeletal: Negative for myalgias and joint pain.  Skin: Negative for rash.  Neurological: Negative for focal weakness and headaches.  Psychiatric/Behavioral: Negative for depression and substance  abuse. The patient is nervous/anxious.     Past Medical History  Diagnosis Date  . Hypertension   . Hyperlipidemia   . ADD (attention deficit disorder)   . GERD (gastroesophageal reflux disease)   . Obesity   . Anxiety   . Allergy     Social History  Substance Use Topics  . Smoking status: Never Smoker   . Smokeless tobacco: Never Used  . Alcohol Use: No    Family History  Problem Relation Age of Onset  . Hypertension Mother   . Diabetes Mother   . Heart disease Father   . Heart attack Father     No Known  Allergies  Objective: Filed Vitals:   12/06/15 1016  BP: 127/83  Pulse: 85  Temp: 98.1 F (36.7 C)  TempSrc: Oral  Height: _0  (1.803 m)  Weight: 336 lb (152.409 kg)   Body mass index is 46.88 kg/(m^2).  Physical Exam  Constitutional: He is oriented to person, place, and time.  He is a little bit nervous but otherwise smiling and in good spirits.  HENT:  Mouth/Throat: No oropharyngeal exudate.  Eyes: Conjunctivae are normal.  Cardiovascular: Normal rate and regular rhythm.   No murmur heard. Pulmonary/Chest: Breath sounds normal.  Abdominal: Soft. He exhibits no mass. There is no tenderness.  Musculoskeletal: Normal range of motion.  Neurological: He is alert and oriented to person, place, and time.  Skin: No rash noted.  Psychiatric: Mood and affect normal.    Lab Results    Problem List Items Addressed This Visit      High   High risk sexual behavior    He is tolerating Truvada PrEP well and not missing doses. I did talk to him again about the importance of limiting the number of partners he has and very careful partner selection and condom use. Truvada will reduce his risk of acquiring HIV infection but has no impact on other sexually transmitted diseases. He received his initial doses of hepatitis A and B vaccines today. He will get repeat lab work today and continue Truvada. He will follow-up in 3 months.      Relevant Medications   emtricitabine-tenofovir (TRUVADA) 200-300 MG tablet   Other Relevant Orders   RPR   Urine cytology ancillary only   Comprehensive metabolic panel   HIV antibody (with reflex)    Other Visit Diagnoses    Need for prophylactic vaccination and inoculation against viral hepatitis    -  Primary    Asymptomatic HIV infection ([place])        Relevant Medications    emtricitabine-tenofovir (TRUVADA) 200-300 MG tablet        Michel Bickers, MD [facility] for Infectious [place] [phone] pager   [phone]  cell 12/06/2015, 10:41 AM

## 2015-12-07 LAB — URINE CYTOLOGY ANCILLARY ONLY
Chlamydia: NEGATIVE
Neisseria Gonorrhea: NEGATIVE

## 2015-12-07 LAB — RPR

## 2015-12-07 LAB — HIV ANTIBODY (ROUTINE TESTING W REFLEX): HIV 1&2 Ab, 4th Generation: NONREACTIVE

## 2016-01-02 ENCOUNTER — Other Ambulatory Visit: Payer: Self-pay | Admitting: *Deleted

## 2016-01-02 DIAGNOSIS — Z7251 High risk heterosexual behavior: Secondary | ICD-10-CM

## 2016-01-02 MED ORDER — EMTRICITABINE-TENOFOVIR DF 200-300 MG PO TABS: 1.0000 | ORAL_TABLET | Freq: Every day | ORAL | Status: DC

## 2016-01-09 ENCOUNTER — Ambulatory Visit (INDEPENDENT_AMBULATORY_CARE_PROVIDER_SITE_OTHER): Payer: No Typology Code available for payment source | Admitting: *Deleted

## 2016-01-09 DIAGNOSIS — Z23 Encounter for immunization: Secondary | ICD-10-CM | POA: Diagnosis not present

## 2016-01-09 DIAGNOSIS — Z7251 High risk heterosexual behavior: Secondary | ICD-10-CM

## 2016-03-08 ENCOUNTER — Ambulatory Visit (INDEPENDENT_AMBULATORY_CARE_PROVIDER_SITE_OTHER): Payer: No Typology Code available for payment source | Admitting: Internal Medicine

## 2016-03-08 ENCOUNTER — Encounter: Payer: Self-pay | Admitting: Internal Medicine

## 2016-03-08 DIAGNOSIS — Z7251 High risk heterosexual behavior: Secondary | ICD-10-CM | POA: Diagnosis not present

## 2016-03-08 MED ORDER — EMTRICITABINE-TENOFOVIR DF 200-300 MG PO TABS: 1.0000 | ORAL_TABLET | Freq: Every day | ORAL | Status: DC

## 2016-03-08 NOTE — Progress Notes (Signed)
[facility] for Infectious Disease  Patient Active Problem List   Diagnosis Date Noted  . High risk sexual behavior 09/07/2015    Priority: High  . Morbid obesity ([place]) 09/07/2015  . GERD (gastroesophageal reflux disease) 09/07/2015  . Seasonal allergies 09/07/2015  . Attention deficit disorder 09/07/2015  . Anxiety 09/07/2015  . PVC (premature ventricular contraction) 05/04/2015  . Benign essential HTN 05/04/2015  . Hyperlipidemia 05/04/2015  . Obstructive sleep apnea 05/04/2015    Patient's Medications  New Prescriptions   No medications on file  Previous Medications   BUPROPION (WELLBUTRIN SR) 150 MG 12 HR TABLET    Take 300 mg by mouth daily.    CYCLOBENZAPRINE (FLEXERIL) 10 MG TABLET    Take 10 mg by mouth 3 (three) times daily as needed for muscle spasms. Reported on 09/07/2015   DICLOFENAC (VOLTAREN) 75 MG EC TABLET    Take 75 mg by mouth 2 (two) times daily. Reported on 09/07/2015   ESOMEPRAZOLE (NEXIUM) 20 MG CAPSULE    Take 20 mg by mouth every morning.   LORATADINE-PSEUDOEPHEDRINE (CLARITIN-D 24-HOUR) 10-240 MG PER 24 HR TABLET    Take 1 tablet by mouth daily.   LOSARTAN-HYDROCHLOROTHIAZIDE (HYZAAR) 100-25 MG PER TABLET    Take 1 tablet by mouth daily.   MOMETASONE (ELOCON) 0.1 % CREAM    Apply 1 application topically 2 (two) times daily as needed (FOR EXCEMA OR RASH PATCHES).   MULTIPLE VITAMIN (MULTIVITAMIN) TABLET    Take 1 tablet by mouth daily.   OMEGA-3 FATTY ACIDS (FISH OIL) 1200 MG CAPS    Take 1,200 mg by mouth daily. Reported on 09/07/2015   OMEPRAZOLE (PRILOSEC) 40 MG CAPSULE    Take 40 mg by mouth every evening.    SIMVASTATIN (ZOCOR) 40 MG TABLET    Take 40 mg by mouth daily.  Modified Medications   Modified Medication Previous Medication   EMTRICITABINE-TENOFOVIR (TRUVADA) 200-300 MG TABLET emtricitabine-tenofovir (TRUVADA) 200-300 MG tablet      Take 1 tablet by mouth daily.    Take 1 tablet by mouth daily.  Discontinued Medications   No medications on file    Subjective: Alexander Solomon is in for his routine follow-up visit. He started preexposure prophylaxis with Truvada almost 6 months ago. He has had no problems tolerating it. He takes it each morning with breakfast and does not recall missing any doses. He has had 5 male sexual contacts since his last visit, all met on [employer]. He states that he has only had oral sex with his partners. One partner attempted anal sex with him but he stopped that. He has not always used condoms for oral sex. He feels completely well now.  Review of Systems: Review of Systems  Constitutional: Negative for fever, chills, weight loss, malaise/fatigue and diaphoresis.  HENT: Negative for sore throat.        No mouth sores.  Respiratory: Negative for cough, sputum production and shortness of breath.   Cardiovascular: Negative for chest pain.  Gastrointestinal: Negative for nausea, vomiting, abdominal pain and diarrhea.  Genitourinary: Negative for dysuria and frequency.       No genital lesions.  Musculoskeletal: Negative for myalgias and joint pain.  Skin: Negative for rash.  Neurological: Negative for focal weakness and headaches.  Psychiatric/Behavioral: Negative for depression and substance abuse. The patient is nervous/anxious.     Past Medical History  Diagnosis Date  . Hypertension   . Hyperlipidemia   . ADD (attention  deficit disorder)   . GERD (gastroesophageal reflux disease)   . Obesity   . Anxiety   . Allergy     Social History  Substance Use Topics  . Smoking status: Never Smoker   . Smokeless tobacco: Never Used  . Alcohol Use: No    Family History  Problem Relation Age of Onset  . Hypertension Mother   . Diabetes Mother   . Heart disease Father   . Heart attack Father     No Known Allergies  Objective: Filed Vitals:   03/08/16 0829  BP: 126/84  Pulse: 99  Temp: 98.2 F (36.8 C)  TempSrc: Oral  Height: _0  (1.803 m)  Weight: 324 lb (146.965 kg)    Body mass index is 45.21 kg/(m^2).  Physical Exam  Constitutional: He is oriented to person, place, and time.  He is a little bit nervous but otherwise smiling and in good spirits.  HENT:  Mouth/Throat: No oropharyngeal exudate.  Eyes: Conjunctivae are normal.  Cardiovascular: Normal rate and regular rhythm.   No murmur heard. Pulmonary/Chest: Breath sounds normal.  Abdominal: Soft. He exhibits no mass. There is no tenderness.  Musculoskeletal: Normal range of motion.  Neurological: He is alert and oriented to person, place, and time.  Skin: No rash noted.  Psychiatric: Mood and affect normal.        Problem List Items Addressed This Visit      High   High risk sexual behavior    He is doing well with his Truvada but I'm concerned about the number of anonymous partners he is having sex with. I talked to him about the importance of limiting the number of partners and choosing partners very carefully. Also talked him again about consistent use of condoms. He will get repeat lab work today and continue Truvada. He did lose his job recently and is at risk for losing his insurance. He will follow-up in 3 months.      Relevant Medications   emtricitabine-tenofovir (TRUVADA) 200-300 MG tablet   Other Relevant Orders   Basic metabolic panel   RPR   Urine cytology ancillary only   HIV antibody       Michel Bickers, MD [facility] for Infectious [facility] [phone] pager   [phone] cell 03/08/2016, 8:50 AM

## 2016-03-08 NOTE — Assessment & Plan Note (Signed)
He is doing well with his Truvada but I'm concerned about the number of anonymous partners he is having sex with. I talked to him about the importance of limiting the number of partners and choosing partners very carefully. Also talked him again about consistent use of condoms. He will get repeat lab work today and continue Truvada. He did lose his job recently and is at risk for losing his insurance. He will follow-up in 3 months.

## 2016-03-09 LAB — BASIC METABOLIC PANEL
BUN: 19 mg/dL (ref 7–25)
CO2: 25 mmol/L (ref 20–31)
Calcium: 9.2 mg/dL (ref 8.6–10.3)
Chloride: 103 mmol/L (ref 98–110)
Creat: 0.8 mg/dL (ref 0.60–1.35)
Glucose, Bld: 93 mg/dL (ref 65–99)
Potassium: 3.7 mmol/L (ref 3.5–5.3)
Sodium: 141 mmol/L (ref 135–146)

## 2016-03-09 LAB — URINE CYTOLOGY ANCILLARY ONLY
Chlamydia: NEGATIVE
Neisseria Gonorrhea: NEGATIVE

## 2016-03-09 LAB — RPR

## 2016-03-09 LAB — HIV ANTIBODY (ROUTINE TESTING W REFLEX): HIV 1&2 Ab, 4th Generation: NONREACTIVE

## 2016-04-13 MED FILL — LOSARTAN-HCTZ 100-25 MG TAB: 100-25 | 30 days supply | Qty: 15 | Fill #0

## 2016-04-13 MED FILL — OMEPRAZOLE DR 40 MG CAPSULE: 40 | 30 days supply | Qty: 30 | Fill #0

## 2016-04-13 MED FILL — BUPROPION HCL XL 300 MG TAB: 300 | 30 days supply | Qty: 30 | Fill #0

## 2016-04-16 MED FILL — SIMVASTATIN 40 MG TABLET: 40 | 90 days supply | Qty: 90 | Fill #0

## 2016-05-17 MED FILL — LOSARTAN-HCTZ 100-25 MG TAB: 100-25 | 30 days supply | Qty: 15 | Fill #1

## 2016-05-17 MED FILL — OMEPRAZOLE DR 40 MG CAPSULE: 40 | 90 days supply | Qty: 90 | Fill #0 | Status: TO

## 2016-05-17 MED FILL — BUPROPION HCL XL 300 MG TAB: 300 | 30 days supply | Qty: 30 | Fill #1

## 2016-06-12 ENCOUNTER — Ambulatory Visit: Payer: No Typology Code available for payment source | Admitting: Internal Medicine

## 2016-06-12 ENCOUNTER — Telehealth: Payer: Self-pay | Admitting: *Deleted

## 2016-06-12 MED FILL — SUCRALFATE 1 GM TABLET: 1 | 33 days supply | Qty: 100 | Fill #0

## 2016-06-12 NOTE — Telephone Encounter (Signed)
Called patient to notify of his missed visit today. No answer, unable to leave a message. Per Dr. Orvan Falconerampbell, if the patient calls back, he can follow up with the pharmacy PrEP clinic. Andree CossHowell, Michelle M, RN

## 2016-06-21 MED FILL — BUPROPION HCL XL 300 MG TAB: 300 | 30 days supply | Qty: 30 | Fill #0

## 2016-06-21 MED FILL — LOSARTAN-HCTZ 100-25 MG TAB: 100-25 | 30 days supply | Qty: 15 | Fill #2

## 2016-09-14 ENCOUNTER — Telehealth: Payer: Self-pay | Admitting: *Deleted

## 2016-09-14 NOTE — Telephone Encounter (Signed)
Truvada refill requested - no follow up appts by the patient.  Last appt 02/2016.  Denied refill.  Requested patient call for appointment on the faxed denial.  Shared information with C. Kuppelweiser, RPH to reach out to the patient.

## 2016-09-20 ENCOUNTER — Encounter: Payer: Self-pay | Admitting: Pharmacist Clinician (PhC)/ Clinical Pharmacy Specialist

## 2016-09-20 ENCOUNTER — Telehealth: Payer: Self-pay | Admitting: Pharmacist Clinician (PhC)/ Clinical Pharmacy Specialist

## 2016-09-20 NOTE — Telephone Encounter (Signed)
Since we couldn't contact him for appt. Sent him an email. Need to be seen for further PreP prescription.

## 2016-10-01 ENCOUNTER — Encounter: Payer: Self-pay | Admitting: Internal Medicine

## 2016-10-01 ENCOUNTER — Telehealth: Payer: Self-pay | Admitting: Pharmacist Clinician (PhC)/ Clinical Pharmacy Specialist

## 2016-10-01 NOTE — Telephone Encounter (Signed)
Change Raney's PreP appt from [facility] to pharmacy. Same day and time

## 2016-10-11 ENCOUNTER — Ambulatory Visit: Payer: No Typology Code available for payment source | Admitting: Internal Medicine

## 2016-10-11 ENCOUNTER — Ambulatory Visit: Payer: No Typology Code available for payment source

## 2016-10-15 ENCOUNTER — Encounter: Payer: Self-pay | Admitting: Internal Medicine

## 2016-10-22 ENCOUNTER — Telehealth: Payer: Self-pay | Admitting: Pharmacist

## 2016-10-22 NOTE — Telephone Encounter (Signed)
Called Alexander Solomon and rescheduled his appointment that was cancelled due to snow for Tuesday 2/6 at 11:30am.

## 2016-10-25 ENCOUNTER — Ambulatory Visit (INDEPENDENT_AMBULATORY_CARE_PROVIDER_SITE_OTHER): Payer: Self-pay | Admitting: Orthopedic Surgery

## 2016-10-30 ENCOUNTER — Ambulatory Visit (INDEPENDENT_AMBULATORY_CARE_PROVIDER_SITE_OTHER): Payer: 59 | Admitting: Pharmacist Clinician (PhC)/ Clinical Pharmacy Specialist

## 2016-10-30 ENCOUNTER — Other Ambulatory Visit: Payer: Self-pay | Admitting: Pharmacist Clinician (PhC)/ Clinical Pharmacy Specialist

## 2016-10-30 ENCOUNTER — Other Ambulatory Visit (HOSPITAL_COMMUNITY)
Admission: RE | Admit: 2016-10-30 | Discharge: 2016-10-30 | Disposition: A | Discharge status: Home / Self Care | Payer: 59 | Source: Ambulatory Visit | Attending: Internal Medicine | Admitting: Internal Medicine

## 2016-10-30 DIAGNOSIS — Z7251 High risk heterosexual behavior: Secondary | ICD-10-CM

## 2016-10-30 DIAGNOSIS — Z113 Encounter for screening for infections with a predominantly sexual mode of transmission: Secondary | ICD-10-CM | POA: Insufficient documentation

## 2016-10-30 DIAGNOSIS — Z23 Encounter for immunization: Secondary | ICD-10-CM | POA: Diagnosis not present

## 2016-10-30 LAB — BASIC METABOLIC PANEL
BUN: 16 mg/dL (ref 7–25)
CO2: 29 mmol/L (ref 20–31)
Calcium: 9 mg/dL (ref 8.6–10.3)
Chloride: 101 mmol/L (ref 98–110)
Creat: 0.83 mg/dL (ref 0.60–1.35)
Glucose, Bld: 75 mg/dL (ref 65–99)
Potassium: 4 mmol/L (ref 3.5–5.3)
Sodium: 139 mmol/L (ref 135–146)

## 2016-10-30 NOTE — Progress Notes (Signed)
HPI: Alexander Solomon is a 31 y.o. male who is here for he PreP visit with pharmacy  Allergies: No Known Allergies  Vitals:    Past Medical History: Past Medical History:  Diagnosis Date  . ADD (attention deficit disorder)   . Allergy   . Anxiety   . GERD (gastroesophageal reflux disease)   . Hyperlipidemia   . Hypertension   . Obesity     Social History: Social History   Social History  . Marital status: Married    Spouse name: katelin  . Number of children: 1  . 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 Smoking status: Never Smoker  . Smokeless tobacco: Never Used  . Alcohol use No  . Drug use: No  . Sexual activity: Not Currently   Other Topics Concern  . Not on file   Social History Narrative  . No narrative on file    Previous Regimen: Truvada  Current Regimen: None  Labs: Hep B S Ab (no units)  Date Value  09/07/2015 NEG   Hepatitis B Surface Ag (no units)  Date Value  09/07/2015 NEGATIVE    CrCl: CrCl cannot be calculated (Patient's most recent lab result is older than the maximum 21 days allowed.).  Lipids: No results found for: CHOL, TRIG, HDL, CHOLHDL, VLDL, LDLCALC  Assessment: Alexander Solomon has not been seen for PreP since June of last year. He lost insurance but has it now again with the new job. At the last visit with Dr. Orvan Falconerampbell, he stated that he has several partners through [place]. He was doing mostly oral sex. Since that appt, he has had about 4 partners with only one of them that he knows. I reiterate that HIV risk to him with random sexual partners.   Since Thanksgiving, he has had one partner. Asked him about his sexual hx with this partner. He stated that they have done both oral and anal now. He is both an insertive and receptive. Explained the risk to him. He doesn't know the HIV status of his partner and condoms use has been very inconsistent now. His risk for HIV is much  higher now. He has no symptoms of acute HIV at this visit. We are going to all tests today.   He is due for his last Hep A/B vaccines today  Recommendations:  HIV, RPR, all sites GC, Bmet, hep C Hep A/B vaccines F/u with test for more Truvada Made appt for 3 mo  Ulyses SouthwardMinh Pham, PharmD, BCPS, AAHIVP, CPP Clinical Infectious Disease Pharmacist [facility] 10/30/2016, 12:14 PM

## 2016-10-30 NOTE — Patient Instructions (Signed)
Will call you when the test is back and prescribe Truvada

## 2016-10-31 ENCOUNTER — Telehealth: Payer: Self-pay | Admitting: Pharmacist Clinician (PhC)/ Clinical Pharmacy Specialist

## 2016-10-31 ENCOUNTER — Encounter: Payer: Self-pay | Admitting: Internal Medicine

## 2016-10-31 ENCOUNTER — Other Ambulatory Visit: Payer: Self-pay | Admitting: Pharmacist Clinician (PhC)/ Clinical Pharmacy Specialist

## 2016-10-31 LAB — RPR

## 2016-10-31 LAB — HIV ANTIBODY (ROUTINE TESTING W REFLEX): HIV 1&2 Ab, 4th Generation: NONREACTIVE

## 2016-10-31 LAB — HEPATITIS C ANTIBODY: HCV Ab: NEGATIVE

## 2016-10-31 MED ORDER — EMTRICITABINE-TENOFOVIR DF 200-300 MG PO TABS: 1.0000 | ORAL_TABLET | Freq: Every day | ORAL | 2 refills | Status: DC

## 2016-10-31 NOTE — Telephone Encounter (Signed)
Called to let him know that the TRV is ready to be picked up and we have activated the copay card for him.

## 2016-10-31 NOTE — Progress Notes (Signed)
Send 3 mo supply to [facility] for PreP

## 2016-11-01 LAB — CYTOLOGY, (ORAL, ANAL, URETHRAL) ANCILLARY ONLY
Chlamydia: NEGATIVE
Chlamydia: NEGATIVE
Neisseria Gonorrhea: NEGATIVE
Neisseria Gonorrhea: NEGATIVE

## 2016-11-01 LAB — URINE CYTOLOGY ANCILLARY ONLY
Chlamydia: NEGATIVE
Neisseria Gonorrhea: NEGATIVE

## 2016-11-05 ENCOUNTER — Encounter (INDEPENDENT_AMBULATORY_CARE_PROVIDER_SITE_OTHER): Payer: Self-pay | Admitting: Orthopedic Surgery

## 2016-11-05 ENCOUNTER — Ambulatory Visit (INDEPENDENT_AMBULATORY_CARE_PROVIDER_SITE_OTHER): Payer: 59

## 2016-11-05 ENCOUNTER — Ambulatory Visit (INDEPENDENT_AMBULATORY_CARE_PROVIDER_SITE_OTHER): Payer: 59 | Admitting: Orthopedic Surgery

## 2016-11-05 DIAGNOSIS — M25561 Pain in right knee: Secondary | ICD-10-CM

## 2016-11-05 HISTORY — DX: Pain in right knee: M25.561

## 2016-11-05 NOTE — Progress Notes (Signed)
Office Visit Note   Patient: Alexander Solomon           Date of Birth: 09/05/1986           MRN: [id number] Visit Date: 11/05/2016 Requested by: Alexander Raider, MD [address] [place], [place] [address] PCP: Alexander Raider, MD  Subjective: Chief Complaint  Patient presents with  . Right Knee - Pain    HPI Alexander Solomon is a 31 year old patient with right knee pain.  The past 6 weeks he describes soreness with sitting or standing for long periods of time with increasing mechanical symptoms in the anterior portion of the knee.  Pain rates 4 out of 10 at 6 out of 10 maximum.  He does have a history of patellar realignment surgery done over 10 years ago in both knees.  He's not having any locking or giving way but he does feel like there is something loose in the knee.  Notably the patient was placed on experimental medication for reflux year ago and that did not work and he is actually had to take omeprazole and Carafate since that time.  Stairs are difficult for him.  Particularly when he goes to church and is difficult.  He feels like the knee needs to pop at times.  He does have daily pain.  He cannot take anti-inflammatories because of this reflux issue.              Review of Systems All systems reviewed are negative as they relate to the chief complaint within the history of present illness.  Patient denies  fevers or chills.    Assessment & Plan: Visit Diagnoses:  1. Acute pain of right knee     Plan: Impression is right knee pain with significantly more patellofemoral crepitus on the right than the left.  There is no effusion.  I think statistically speaking this represents either patella femoral wear which we just cannot see on plain radiographs potential synovitis within the knee or early manifestation of lateral compartment arthritis and/or meniscal tearing.  Plan is MRI scan because he's had symptoms for 6 weeks he tried over-the-counter Tylenol without much relief in his  pain is occurring on daily basis.  He does have excellent quad and calf tone in the legs and good flexibility.  I'll see him back after that study  Follow-Up Instructions: No Follow-up on file.   Orders:  Orders Placed This Encounter  Procedures  . XR KNEE 3 VIEW RIGHT   No orders of the defined types were placed in this encounter.     Procedures: No procedures performed   Clinical Data: No additional findings.  Objective: Vital Signs: There were no vitals taken for this visit.  Physical Exam   Constitutional: Patient appears well-developed HEENT:  Head: Normocephalic Eyes:EOM are normal Neck: Normal range of motion Cardiovascular: Normal rate Pulmonary/chest: Effort normal Neurologic: Patient is alert Skin: Skin is warm Psychiatric: Patient has normal mood and affect    Ortho Exam orthopedic exam demonstrates increased body mass index slight valgus alignment bilaterally chemise palpable pedal pulses clamp which ligaments are stable patellofemoral crepitus increased right versus left has good patellar mobility with good endpoint with about 1 quadrant lateral translation on both knees with solid endpoint.  Range of motion is full.  Pedal pulses palpable.  No groin pain with internal/external rotation of the leg.  Well-healed surgical incisions on the medial aspect of both knees.  Specialty Comments:  No specialty comments available.  Imaging: Xr Knee 3 View Right  Result Date: 11/05/2016 3 views right knee reviewed AP lateral merchant view.  Enthesopathic changes noted at the medial epicondyles attachment of the MP FL.  Joint space is maintained on the coronal view although slightly narrowed in the lateral compartment.  Patella without spurring.  Some joint space narrowing is noted on the merchant view on both knees.  No loose bodies noted.    PMFS History: Patient Active Problem List   Diagnosis Date Noted  . Acute pain of right knee 11/05/2016  . Morbid obesity  ([facility]) 09/07/2015  . GERD (gastroesophageal reflux disease) 09/07/2015  . Seasonal allergies 09/07/2015  . Attention deficit disorder 09/07/2015  . Anxiety 09/07/2015  . High risk sexual behavior 09/07/2015  . PVC (premature ventricular contraction) 05/04/2015  . Benign essential HTN 05/04/2015  . Hyperlipidemia 05/04/2015  . Obstructive sleep apnea 05/04/2015   Past Medical History:  Diagnosis Date  . ADD (attention deficit disorder)   . Allergy   . Anxiety   . GERD (gastroesophageal reflux disease)   . Hyperlipidemia   . Hypertension   . Obesity     Family History  Problem Relation Age of Onset  . Hypertension Mother   . Diabetes Mother   . Heart disease Father   . Heart attack Father     Past Surgical History:  Procedure Laterality Date  . 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 Smoking status: Never Smoker  . Smokeless tobacco: Never Used  . Alcohol use No  . Drug use: No  . Sexual activity: Not Currently

## 2016-11-06 ENCOUNTER — Ambulatory Visit: Payer: No Typology Code available for payment source | Admitting: Internal Medicine

## 2016-11-07 ENCOUNTER — Ambulatory Visit (INDEPENDENT_AMBULATORY_CARE_PROVIDER_SITE_OTHER): Payer: Self-pay | Admitting: Orthopedic Surgery

## 2016-11-15 ENCOUNTER — Ambulatory Visit: Payer: No Typology Code available for payment source | Admitting: Internal Medicine

## 2016-11-26 ENCOUNTER — Ambulatory Visit (INDEPENDENT_AMBULATORY_CARE_PROVIDER_SITE_OTHER): Payer: 59 | Admitting: Orthopedic Surgery

## 2017-01-22 ENCOUNTER — Ambulatory Visit: Payer: 59

## 2017-01-30 ENCOUNTER — Telehealth: Payer: Self-pay | Admitting: Pharmacist Clinician (PhC)/ Clinical Pharmacy Specialist

## 2017-01-30 NOTE — Telephone Encounter (Signed)
I saw Alexander Solomon back in Feb for PreP. He never picked up his meds because he stated that no one from the pharmacy called. I called him back then to tell him that it's ready to be picked up. He has not been on truvada. He has 1 partner since the last visit and said that the partner is neg. Advised him that he will never know for sure and encourage PreP. He said that he will think about it and call back to make an appt if he would like to do it.

## 2017-05-20 ENCOUNTER — Other Ambulatory Visit: Payer: Self-pay | Admitting: Family Medicine

## 2017-05-20 DIAGNOSIS — R609 Edema, unspecified: Secondary | ICD-10-CM

## 2017-05-20 DIAGNOSIS — N50811 Right testicular pain: Secondary | ICD-10-CM

## 2017-05-21 ENCOUNTER — Ambulatory Visit
Admission: RE | Admit: 2017-05-21 | Discharge: 2017-05-21 | Disposition: A | Discharge status: Home / Self Care | Payer: No Typology Code available for payment source | Source: Ambulatory Visit | Attending: Family Medicine | Admitting: Family Medicine

## 2017-05-21 DIAGNOSIS — N50811 Right testicular pain: Secondary | ICD-10-CM

## 2017-05-21 DIAGNOSIS — R609 Edema, unspecified: Secondary | ICD-10-CM

## 2017-08-23 ENCOUNTER — Ambulatory Visit (INDEPENDENT_AMBULATORY_CARE_PROVIDER_SITE_OTHER): Payer: Managed Care, Other (non HMO)

## 2017-08-23 ENCOUNTER — Telehealth (INDEPENDENT_AMBULATORY_CARE_PROVIDER_SITE_OTHER): Payer: Self-pay | Admitting: Orthopedic Surgery

## 2017-08-23 ENCOUNTER — Ambulatory Visit
Admission: RE | Admit: 2017-08-23 | Discharge: 2017-08-23 | Disposition: A | Discharge status: Home / Self Care | Payer: Worker's Compensation | Source: Ambulatory Visit | Attending: Orthopedic Surgery | Admitting: Orthopedic Surgery

## 2017-08-23 ENCOUNTER — Ambulatory Visit (INDEPENDENT_AMBULATORY_CARE_PROVIDER_SITE_OTHER): Payer: Worker's Compensation | Admitting: Orthopedic Surgery

## 2017-08-23 ENCOUNTER — Encounter (INDEPENDENT_AMBULATORY_CARE_PROVIDER_SITE_OTHER): Payer: Self-pay | Admitting: Orthopedic Surgery

## 2017-08-23 DIAGNOSIS — S62101A Fracture of unspecified carpal bone, right wrist, initial encounter for closed fracture: Secondary | ICD-10-CM

## 2017-08-23 DIAGNOSIS — G8929 Other chronic pain: Secondary | ICD-10-CM

## 2017-08-23 DIAGNOSIS — M25561 Pain in right knee: Secondary | ICD-10-CM

## 2017-08-23 MED ORDER — METHOCARBAMOL 500 MG PO TABS: 500.0000 mg | ORAL_TABLET | Freq: Three times a day (TID) | ORAL | 0 refills | Status: DC | PRN

## 2017-08-23 NOTE — Telephone Encounter (Signed)
Tried entering pt WC insurance in the system and it wouldn't pull up. Pt was Sched for a CT scan today and  couldn't receive it due to not having a claim. Pt received claim number and ive entered it into the system.Ive tried to update the insurance  In the system and [facility] to help me. [facility] told me

## 2017-08-23 NOTE — Telephone Encounter (Signed)
Pt needs t

## 2017-08-24 ENCOUNTER — Encounter (INDEPENDENT_AMBULATORY_CARE_PROVIDER_SITE_OTHER): Payer: Self-pay | Admitting: Orthopedic Surgery
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
## 2017-08-27 ENCOUNTER — Telehealth (INDEPENDENT_AMBULATORY_CARE_PROVIDER_SITE_OTHER): Payer: Self-pay

## 2017-08-27 NOTE — Telephone Encounter (Signed)
Faxed the 08/23/17 office note, work note, and CT report to case mgr per her request

## 2017-08-29 ENCOUNTER — Ambulatory Visit (INDEPENDENT_AMBULATORY_CARE_PROVIDER_SITE_OTHER): Payer: Worker's Compensation | Admitting: Orthopedic Surgery

## 2017-08-29 ENCOUNTER — Encounter (INDEPENDENT_AMBULATORY_CARE_PROVIDER_SITE_OTHER): Payer: Self-pay | Admitting: Orthopedic Surgery

## 2017-08-29 DIAGNOSIS — S62101D Fracture of unspecified carpal bone, right wrist, subsequent encounter for fracture with routine healing: Secondary | ICD-10-CM

## 2017-08-29 DIAGNOSIS — S62102D Fracture of unspecified carpal bone, left wrist, subsequent encounter for fracture with routine healing: Secondary | ICD-10-CM

## 2017-08-29 NOTE — Progress Notes (Signed)
   Post-Op Visit Note   Patient: Alexander Solomon           Date of Birth: 02/21/1986           MRN: [id number] Visit Date: 08/29/2017 PCP: Lupita RaiderShaw, Kimberlee, MD   Assessment & Plan:  Chief Complaint:  Chief Complaint  Patient presents with  . Right Wrist - Follow-up, Fracture  . Left Wrist - Fracture, Follow-up   Visit Diagnoses: No diagnosis found. Plan: Alexander Solomon comes in today to follow-up bilateral wrist fractures.  He is having continued pain in the right wrist.  Left wrist is doing well.  On examination patient has good finger range of motion and moves the left wrist well.  I reviewed the CT scan with Dr. Mina MarbleWeingold.  He agrees that open reduction internal fixation is indicated for the right wrist due to the radial styloid depression fracture as well as that lunate fossa piece which is rotated and now reduced.  Even though this is a small fragment reduction is indicated.  This will be a complicated procedure which would be best done by a fellowship trained hand surgeon.  Dr. Mina MarbleWeingold has agreed to see the patient and will evaluate and likely perform surgery next week.  The cast is split today.  And he will see him later on this afternoon  Follow-Up Instructions: Return if symptoms worsen or fail to improve.   Orders:  No orders of the defined types were placed in this encounter.  No orders of the defined types were placed in this encounter.   Imaging: No results found.  PMFS History: Patient Active Problem List   Diagnosis Date Noted  . Acute pain of right knee 11/05/2016  . Morbid obesity (HCC) 09/07/2015  . GERD (gastroesophageal reflux disease) 09/07/2015  . Seasonal allergies 09/07/2015  . Attention deficit disorder 09/07/2015  . Anxiety 09/07/2015  . High risk sexual behavior 09/07/2015  . PVC (premature ventricular contraction) 05/04/2015  . Benign essential HTN 05/04/2015  . Hyperlipidemia 05/04/2015  . Obstructive sleep apnea 05/04/2015   Past Medical History:    Diagnosis Date  . ADD (attention deficit disorder)   . Allergy   . Anxiety   . GERD (gastroesophageal reflux disease)   . Hyperlipidemia   . Hypertension   . Obesity     Family History  Problem Relation Age of Onset  . Hypertension Mother   . Diabetes Mother   . Heart disease Father   . Heart attack Father     Past Surgical History:  Procedure Laterality Date  . KNEE SURGERY Bilateral    Social History   Occupational History  . 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 Smoking status: Never Smoker  . Smokeless tobacco: Never Used  Substance and Sexual Activity  . Alcohol use: No    Alcohol/week: 0.0 oz  . Drug use: No  . Sexual activity: Not Currently

## 2017-08-30 ENCOUNTER — Ambulatory Visit (INDEPENDENT_AMBULATORY_CARE_PROVIDER_SITE_OTHER): Payer: Worker's Compensation | Admitting: Radiology

## 2017-08-30 DIAGNOSIS — S62102D Fracture of unspecified carpal bone, left wrist, subsequent encounter for fracture with routine healing: Secondary | ICD-10-CM

## 2017-08-30 DIAGNOSIS — S62101D Fracture of unspecified carpal bone, right wrist, subsequent encounter for fracture with routine healing: Secondary | ICD-10-CM

## 2017-08-30 NOTE — Progress Notes (Signed)
Patient came in this morning for cast change. He was seen in the office yesterday by Dr. August Saucerean and existing cast was cut prior to appointment that was made for him with Dr. Mina MarbleWeingold. Patient states that [employer] did not get him an appointment in enough time and that he would not be seen by the hand specialist until Monday. He is concerned that this appointment may be rescheduled due to weather. He is able to move his wrist in the cast and has increased pain. The cast was changed with no complications. I advised patient to call if any problems.

## 2017-10-08 ENCOUNTER — Other Ambulatory Visit: Payer: Self-pay

## 2018-01-07 ENCOUNTER — Telehealth: Payer: Self-pay | Admitting: Pharmacist Clinician (PhC)/ Clinical Pharmacy Specialist

## 2018-01-07 NOTE — Telephone Encounter (Signed)
Pt would like to re-engage back on PrEP. Left a VM to call back for an appt.

## 2018-01-10 ENCOUNTER — Telehealth: Payer: Self-pay | Admitting: Pharmacist Clinician (PhC)/ Clinical Pharmacy Specialist

## 2018-01-10 NOTE — Telephone Encounter (Signed)
Alexander Solomon called back to say that he has to reschedule his PrEP appt for next Monday because he has a duplicate appt. He wanted to see if Friday was possible. Put him down for 4/26.

## 2018-01-13 ENCOUNTER — Ambulatory Visit: Payer: Self-pay

## 2018-01-17 ENCOUNTER — Ambulatory Visit (INDEPENDENT_AMBULATORY_CARE_PROVIDER_SITE_OTHER): Payer: Managed Care, Other (non HMO) | Admitting: Pharmacist Clinician (PhC)/ Clinical Pharmacy Specialist

## 2018-01-17 ENCOUNTER — Other Ambulatory Visit (HOSPITAL_COMMUNITY)
Admission: RE | Admit: 2018-01-17 | Discharge: 2018-01-17 | Disposition: A | Discharge status: Home / Self Care | Payer: Managed Care, Other (non HMO) | Source: Ambulatory Visit | Attending: Infectious Disease | Admitting: Infectious Disease

## 2018-01-17 DIAGNOSIS — Z7252 High risk homosexual behavior: Secondary | ICD-10-CM | POA: Insufficient documentation

## 2018-01-17 LAB — BASIC METABOLIC PANEL
BUN: 23 mg/dL (ref 7–25)
CO2: 25 mmol/L (ref 20–32)
Calcium: 9.6 mg/dL (ref 8.6–10.3)
Chloride: 104 mmol/L (ref 98–110)
Creat: 0.82 mg/dL (ref 0.60–1.35)
Glucose, Bld: 104 mg/dL — ABNORMAL HIGH (ref 65–99)
Potassium: 4.4 mmol/L (ref 3.5–5.3)
Sodium: 140 mmol/L (ref 135–146)

## 2018-01-17 NOTE — Progress Notes (Signed)
Date:  01/17/2018   Insured   [x]    Uninsured  []    HPI  Alexander Solomon is a 32 y.o. male who is here to re-establish care with us for PrEP  Demographics Race:  White or Caucasian [1] Marital Status:  Married  Allergies No Known Allergies  Past Medical History:  Diagnosis Date  . ADD (attention deficit disorder)   . Allergy   . Anxiety   . GERD (gastroesophageal reflux disease)   . Hyperlipidemia   . Hypertension   . Obesity     Outpatient Encounter Medications as of 01/17/2018  Medication Sig  . fluticasone (FLONASE) 50 MCG/ACT nasal spray Place 2 sprays into both nostrils daily.  Marland Kitchen. loratadine-pseudoephedrine (CLARITIN-D 24-HOUR) 10-240 MG per 24 hr tablet Take 1 tablet by mouth daily.  Marland Kitchen. losartan-hydrochlorothiazide (HYZAAR) 100-25 MG per tablet Take 1 tablet by mouth daily.  . Multiple Vitamin (MULTIVITAMIN) tablet Take 1 tablet by mouth daily.  Marland Kitchen. omeprazole (PRILOSEC) 40 MG capsule Take 40 mg by mouth every evening.   . simvastatin (ZOCOR) 40 MG tablet Take 40 mg by mouth daily.  . cyclobenzaprine (FLEXERIL) 10 MG tablet Take 10 mg by mouth 3 (three) times daily as needed for muscle spasms. Reported on 09/07/2015  . diclofenac (VOLTAREN) 75 MG EC tablet Take 75 mg by mouth 2 (two) times daily. Reported on 09/07/2015  . lisdexamfetamine (VYVANSE) 20 MG capsule Take 60 mg by mouth daily.  . [DISCONTINUED] buPROPion (WELLBUTRIN SR) 150 MG 12 hr tablet Take 300 mg by mouth daily.   . [DISCONTINUED] emtricitabine-tenofovir (TRUVADA) 200-300 MG tablet Take 1 tablet by mouth daily.  . [DISCONTINUED] methocarbamol (ROBAXIN) 500 MG tablet Take 1 tablet (500 mg total) by mouth every 8 (eight) hours as needed for muscle spasms. (Patient not taking: Reported on 01/17/2018)  . [DISCONTINUED] mometasone (ELOCON) 0.1 % cream Apply 1 application topically 2 (two) times daily as needed (FOR EXCEMA OR RASH PATCHES).  . [DISCONTINUED] Omega-3 Fatty Acids (FISH OIL) 1200 MG CAPS Take 1,200 mg  by mouth daily. Reported on 09/07/2015   No facility-administered encounter medications on file as of 01/17/2018.     Social History   Tobacco Use  Smoking Status Never Smoker  Smokeless Tobacco Never Used   Social History   Substance and Sexual Activity  Alcohol Use No  . Alcohol/week: 0.0 oz    Drug use?   Yes []  No [x]   Injectable drug use?   Yes []     No [x]   Sexual History  Missing doses? Yes []    No  []   CHL HIV PREP FLOWSHEET RESULTS 01/17/2018  Insurance Status Insured  How did you hear? Referred from Dr Clelia CroftShaw  Gender at birth Male  Gender identity cis-Male  Risk for HIV Condomless vaginal or anal intercourse  Sex Partners Men only  # sex partners past 3-6 mos 1-3  Sex activity preferences Insertive and receptive;Oral  Condom use No  Partners genders and ages 60M 20-24;M 25-29;M 4330-49  Treated for STI? N/A  HIV symptoms? N/A  PrEP Eligibility Substantial risk for HIV;HIV negative  Paper work received? No    Labs: Creatinine Lab Results  Component Value Date   CREATININE 0.83 10/30/2016   CREATININE 0.80 03/08/2016   CREATININE 0.88 12/06/2015    HIV Lab Results  Component Value Date   HIV NONREACTIVE 10/30/2016   HIV NONREACTIVE 03/08/2016   HIV NONREACTIVE 12/06/2015   HIV NONREACTIVE 09/07/2015    GFR CrCl cannot be  calculated (Patient's most recent lab result is older than the maximum 21 days allowed.).  Hepatitis B Lab Results  Component Value Date   HEPBSAB NEG 09/07/2015   Lab Results  Component Value Date   HEPBSAG NEGATIVE 09/07/2015    Hepatitis C Lab Results  Component Value Date   HCVAB NEGATIVE 10/30/2016    Hepatitis A Lab Results  Component Value Date   HAV NON REACTIVE 09/07/2015    RPR and STI Lab Results  Component Value Date   LABRPR NON REAC 10/30/2016   LABRPR NON REAC 03/08/2016   LABRPR NON REAC 12/06/2015    STI Results GC CT  10/30/2016 Negative Negative  10/30/2016 Negative Negative  10/30/2016  Negative Negative  03/08/2016 Negative Negative  12/06/2015 Negative Negative     Assessment  Alexander Solomon saw me last year for PrEP but never started on it. He said it was mainly due to being in a relationship with the same partner. However, they have broken up a few weeks ago. Since then he has had about 2 oral sexual encounters with 2 different people. He admitted to not using condoms when he was with the 1 partner but he was consistent before that. He didn't use condoms the previous 2 encounters because it was oral. Counseled on the importance of condom use again at this visit to decrease the chance of STIs. He stated that he is usually very cautious when it comes to sexual encounters because he works in [employer].   Counseled on the importance of adherence, side effects, ect today. He has had his hep A and B vaccination series but we will do titers today. We will do all sites swabs today. He doesn't have any acute HIV symptoms.    Recommendations   HIV PrEP labs, STDs, bmet, hepatitis titers If neg, we will send in 30d supply of Truvada F/u in 1 month  Ulyses Southward, PharmD, BCPS, AAHIVP, CPP Infectious Disease Pharmacist Pager: [phone] 01/17/2018 9:56 AM

## 2018-01-18 LAB — HIV ANTIBODY (ROUTINE TESTING W REFLEX): HIV 1&2 Ab, 4th Generation: NONREACTIVE

## 2018-01-19 LAB — HEPATITIS A ANTIBODY, TOTAL: Hepatitis A AB,Total: REACTIVE — AB

## 2018-01-19 LAB — HEPATITIS B SURFACE ANTIBODY,QUALITATIVE: Hep B S Ab: NONREACTIVE

## 2018-01-20 ENCOUNTER — Other Ambulatory Visit: Payer: Self-pay | Admitting: Pharmacist Clinician (PhC)/ Clinical Pharmacy Specialist

## 2018-01-20 LAB — URINE CYTOLOGY ANCILLARY ONLY
Chlamydia: NEGATIVE
Neisseria Gonorrhea: NEGATIVE

## 2018-01-20 LAB — CYTOLOGY, (ORAL, ANAL, URETHRAL) ANCILLARY ONLY
Chlamydia: NEGATIVE
Chlamydia: NEGATIVE
Neisseria Gonorrhea: NEGATIVE
Neisseria Gonorrhea: POSITIVE — AB

## 2018-01-20 LAB — RPR: RPR Ser Ql: NONREACTIVE

## 2018-01-20 MED ORDER — EMTRICITABINE-TENOFOVIR DF 200-300 MG PO TABS: 1.0000 | ORAL_TABLET | Freq: Every day | ORAL | 0 refills | Status: DC

## 2018-01-20 MED FILL — TRUVADA 200-300 MG TABS: 200-300 | 30 days supply | Qty: 30 | Fill #0

## 2018-01-20 NOTE — Progress Notes (Signed)
HIV neg so 1 mo of Truvada 

## 2018-01-21 ENCOUNTER — Telehealth: Payer: Self-pay | Admitting: *Deleted

## 2018-01-21 NOTE — Telephone Encounter (Signed)
Received positive gonorrhea test result for PrEP patient. Forwarded to pharmacy. Andree Coss, RN

## 2018-01-22 ENCOUNTER — Ambulatory Visit (INDEPENDENT_AMBULATORY_CARE_PROVIDER_SITE_OTHER): Payer: Managed Care, Other (non HMO) | Admitting: Pharmacist Clinician (PhC)/ Clinical Pharmacy Specialist

## 2018-01-22 ENCOUNTER — Ambulatory Visit: Payer: Self-pay

## 2018-01-22 DIAGNOSIS — A549 Gonococcal infection, unspecified: Secondary | ICD-10-CM

## 2018-01-22 MED ORDER — AZITHROMYCIN 250 MG PO TABS: 1000.0000 mg | ORAL_TABLET | Freq: Once | ORAL | Status: DC

## 2018-01-22 MED ORDER — CEFTRIAXONE SODIUM 250 MG IJ SOLR: 250.0000 mg | Freq: Once | INTRAMUSCULAR | Status: DC

## 2018-01-22 MED ORDER — AZITHROMYCIN 250 MG PO TABS
1000.0000 mg | ORAL_TABLET | Freq: Once | ORAL | Status: AC
  Administered 2018-01-22: 1000 mg via ORAL

## 2018-01-22 MED ORDER — CEFTRIAXONE SODIUM 250 MG IJ SOLR
250.0000 mg | Freq: Once | INTRAMUSCULAR | Status: AC
  Administered 2018-01-22: 250 mg via INTRAMUSCULAR

## 2018-01-22 NOTE — Telephone Encounter (Signed)
Coming in at 71 today

## 2018-02-14 ENCOUNTER — Ambulatory Visit (INDEPENDENT_AMBULATORY_CARE_PROVIDER_SITE_OTHER): Payer: Managed Care, Other (non HMO) | Admitting: Pharmacist Clinician (PhC)/ Clinical Pharmacy Specialist

## 2018-02-14 ENCOUNTER — Other Ambulatory Visit (HOSPITAL_COMMUNITY)
Admission: RE | Admit: 2018-02-14 | Discharge: 2018-02-14 | Disposition: A | Discharge status: Home / Self Care | Payer: Managed Care, Other (non HMO) | Source: Ambulatory Visit | Attending: Internal Medicine | Admitting: Internal Medicine

## 2018-02-14 DIAGNOSIS — Z23 Encounter for immunization: Secondary | ICD-10-CM | POA: Diagnosis not present

## 2018-02-14 DIAGNOSIS — Z7252 High risk homosexual behavior: Secondary | ICD-10-CM | POA: Insufficient documentation

## 2018-02-14 NOTE — Progress Notes (Signed)
Date:  02/14/2018   Insured      Uninsured     HPI  Alexander Solomon is a 32 y.o. male who is here for his 1 mo PrEP visit with pharmacy.   Demographics Race:  White or Caucasian [1] Marital Status:  Married  Allergies No Known Allergies  Past Medical History:  Diagnosis Date  . ADD (attention deficit disorder)   . Allergy   . Anxiety   . GERD (gastroesophageal reflux disease)   . Hyperlipidemia   . Hypertension   . Obesity     Outpatient Encounter Medications as of 02/14/2018  Medication Sig  . cyclobenzaprine (FLEXERIL) 10 MG tablet Take 10 mg by mouth 3 (three) times daily as needed for muscle spasms. Reported on 09/07/2015  . diclofenac (VOLTAREN) 75 MG EC tablet Take 75 mg by mouth 2 (two) times daily. Reported on 09/07/2015  . emtricitabine-tenofovir (TRUVADA) 200-300 MG tablet Take 1 tablet by mouth daily.  . fluticasone (FLONASE) 50 MCG/ACT nasal spray Place 2 sprays into both nostrils daily.  Marland Kitchen lisdexamfetamine (VYVANSE) 20 MG capsule Take 60 mg by mouth daily.  Marland Kitchen loratadine (CLARITIN) 10 MG tablet Take 10 mg by mouth daily.  Marland Kitchen losartan-hydrochlorothiazide (HYZAAR) 100-25 MG per tablet Take 1 tablet by mouth daily.  . Multiple Vitamin (MULTIVITAMIN) tablet Take 1 tablet by mouth daily.  Marland Kitchen omeprazole (PRILOSEC) 40 MG capsule Take 40 mg by mouth every evening.   . simvastatin (ZOCOR) 40 MG tablet Take 40 mg by mouth daily.  . [DISCONTINUED] loratadine-pseudoephedrine (CLARITIN-D 24-HOUR) 10-240 MG per 24 hr tablet Take 1 tablet by mouth daily.   No facility-administered encounter medications on file as of 02/14/2018.     Social History   Tobacco Use  Smoking Status Never Smoker  Smokeless Tobacco Never Used   Social History   Substance and Sexual Activity  Alcohol Use No  . Alcohol/week: 0.0 oz    Drug use?   Yes  No   Injectable drug use?   Yes     No   Sexual History  Missing doses? Yes    No    CHL HIV PREP FLOWSHEET RESULTS  02/14/2018 01/17/2018  Insurance Status Insured Insured  How did you hear? Family Physician Referred from Dr Clelia Croft  Gender at birth Male Male  Gender identity cis-Male cis-Male  Risk for HIV Condomless vaginal or anal intercourse;>5 partners in past 6 mos (regardless of condom use);Hx of STI Condomless vaginal or anal intercourse  Sex Partners Men only Men only  # sex partners past 3-6 mos 4-6 1-3  Sex activity preferences Insertive and receptive;Oral Insertive and receptive;Oral  Condom use Yes No  % condom use 38 -  Partners genders and ages 18 20-24;M 25-29;M 29-49 M 20-24;M 25-29;M 30-49  Treated for STI? Yes N/A  HIV symptoms? N/A N/A  PrEP Eligibility HIV negative;CrCl >60 ml/min;Substantial risk for HIV Substantial risk for HIV;HIV negative  Paper work received? No No    Labs: Creatinine Lab Results  Component Value Date   CREATININE 0.82 01/17/2018   CREATININE 0.83 10/30/2016   CREATININE 0.80 03/08/2016   CREATININE 0.88 12/06/2015    HIV Lab Results  Component Value Date   HIV NON-REACTIVE 01/17/2018   HIV NONREACTIVE 10/30/2016   HIV NONREACTIVE 03/08/2016   HIV NONREACTIVE 12/06/2015   HIV NONREACTIVE 09/07/2015    GFR CrCl cannot be calculated (Patient's most recent lab result is older than the maximum 21 days allowed.).  Hepatitis B Lab  Results  Component Value Date   HEPBSAB NON-REACTIVE 01/17/2018   Lab Results  Component Value Date   HEPBSAG NEGATIVE 09/07/2015    Hepatitis C Lab Results  Component Value Date   HCVAB NEGATIVE 10/30/2016    Hepatitis A Lab Results  Component Value Date   HAV REACTIVE (A) 01/17/2018    RPR and STI Lab Results  Component Value Date   LABRPR NON-REACTIVE 01/17/2018   LABRPR NON REAC 10/30/2016   LABRPR NON REAC 03/08/2016   LABRPR NON REAC 12/06/2015    STI Results GC CT  01/17/2018 Negative Negative  01/17/2018 **POSITIVE**(A) Negative  01/17/2018 Negative Negative  10/30/2016 Negative Negative    10/30/2016 Negative Negative  10/30/2016 Negative Negative  03/08/2016 Negative Negative  12/06/2015 Negative Negative     Assessment  Alexander Solomon is here today after being on PrEP for a month. He was tested positive last month for gonorrhea and came back here for treatment with roc/azith. The oral swab was the one that was positive. He has had more oral sex since then so we are going to swab again today (oral). He has not missed any doses. He does confessed to good condom use except for oral sex. He denied any STD or acute HIV symptoms.   We are going to check on his renal function and HIV today before prescribing 3 mo of TRV. He did complained of a bill being denied by his insurance for the cytology swabs. He said that the cost was around $400. He did not have Hbsab so we will re-vacinnate the series today.    Recommendations   HIV ab, bmet, oral GC Hep B #1 today, second shot next month Truvada x 3 mo if neg F/u with pharmacy in 3 month  Ulyses Southward, PharmD, BCPS, AAHIVP, CPP Infectious Disease Pharmacist Pager: [phone] 02/14/2018 9:45 AM

## 2018-02-15 LAB — BASIC METABOLIC PANEL
BUN: 18 mg/dL (ref 7–25)
CO2: 27 mmol/L (ref 20–32)
Calcium: 9.3 mg/dL (ref 8.6–10.3)
Chloride: 103 mmol/L (ref 98–110)
Creat: 0.82 mg/dL (ref 0.60–1.35)
Glucose, Bld: 102 mg/dL — ABNORMAL HIGH (ref 65–99)
Potassium: 4.2 mmol/L (ref 3.5–5.3)
Sodium: 138 mmol/L (ref 135–146)

## 2018-02-15 LAB — HIV ANTIBODY (ROUTINE TESTING W REFLEX): HIV 1&2 Ab, 4th Generation: NONREACTIVE

## 2018-02-18 ENCOUNTER — Telehealth: Payer: Self-pay | Admitting: Pharmacist Clinician (PhC)/ Clinical Pharmacy Specialist

## 2018-02-18 LAB — CYTOLOGY, (ORAL, ANAL, URETHRAL) ANCILLARY ONLY
Chlamydia: NEGATIVE
Neisseria Gonorrhea: NEGATIVE

## 2018-02-18 MED ORDER — EMTRICITABINE-TENOFOVIR DF 200-300 MG PO TABS: 1.0000 | ORAL_TABLET | Freq: Every day | ORAL | 2 refills | Status: DC

## 2018-02-18 MED FILL — TRUVADA 200-300 MG TABS: 200-300 | 30 days supply | Qty: 30 | Fill #0

## 2018-02-18 NOTE — Telephone Encounter (Signed)
HIV ab neg so 3 mo TRV 

## 2018-03-13 MED FILL — TRUVADA 200-300 MG TABS: 200-300 | 30 days supply | Qty: 30 | Fill #1

## 2018-03-21 ENCOUNTER — Ambulatory Visit: Payer: Self-pay

## 2018-04-11 MED FILL — TRUVADA 200-300 MG TABS: 200-300 | 30 days supply | Qty: 30 | Fill #2
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
## 2018-05-05 ENCOUNTER — Other Ambulatory Visit (HOSPITAL_COMMUNITY)
Admission: RE | Admit: 2018-05-05 | Discharge: 2018-05-05 | Disposition: A | Discharge status: Home / Self Care | Payer: Managed Care, Other (non HMO) | Source: Ambulatory Visit | Attending: Infectious Disease | Admitting: Infectious Disease

## 2018-05-05 ENCOUNTER — Ambulatory Visit (INDEPENDENT_AMBULATORY_CARE_PROVIDER_SITE_OTHER): Payer: Managed Care, Other (non HMO) | Admitting: Pharmacist

## 2018-05-05 DIAGNOSIS — Z7252 High risk homosexual behavior: Secondary | ICD-10-CM | POA: Insufficient documentation

## 2018-05-05 DIAGNOSIS — Z23 Encounter for immunization: Secondary | ICD-10-CM

## 2018-05-05 NOTE — Progress Notes (Signed)
Date:  05/05/2018   HPI: Alexander Solomon is a 32 y.o. male who presents to the [facility] clinic for his 3 month PrEP follow-up.  Insured   [x]    Uninsured  []    Patient Active Problem List   Diagnosis Date Noted  . Acute pain of right knee 11/05/2016  . Morbid obesity (HCC) 09/07/2015  . GERD (gastroesophageal reflux disease) 09/07/2015  . Seasonal allergies 09/07/2015  . Attention deficit disorder 09/07/2015  . Anxiety 09/07/2015  . High risk sexual behavior 09/07/2015  . PVC (premature ventricular contraction) 05/04/2015  . Benign essential HTN 05/04/2015  . Hyperlipidemia 05/04/2015  . Obstructive sleep apnea 05/04/2015    Patient's Medications  New Prescriptions   No medications on file  Previous Medications   CYCLOBENZAPRINE (FLEXERIL) 10 MG TABLET    Take 10 mg by mouth 3 (three) times daily as needed for muscle spasms. Reported on 09/07/2015   EMTRICITABINE-TENOFOVIR (TRUVADA) 200-300 MG TABLET    Take 1 tablet by mouth daily.   FLUTICASONE (FLONASE) 50 MCG/ACT NASAL SPRAY    Place 2 sprays into both nostrils daily.   LISDEXAMFETAMINE (VYVANSE) 20 MG CAPSULE    Take 60 mg by mouth daily.   LORATADINE (CLARITIN) 10 MG TABLET    Take 10 mg by mouth daily.   LOSARTAN-HYDROCHLOROTHIAZIDE (HYZAAR) 100-25 MG PER TABLET    Take 1 tablet by mouth daily.   MULTIPLE VITAMIN (MULTIVITAMIN) TABLET    Take 1 tablet by mouth daily.   OMEPRAZOLE (PRILOSEC) 40 MG CAPSULE    Take 40 mg by mouth every evening.    SERTRALINE (ZOLOFT) 50 MG TABLET    Take 50 mg by mouth daily.   SIMVASTATIN (ZOCOR) 40 MG TABLET    Take 40 mg by mouth daily.  Modified Medications   No medications on file  Discontinued Medications   DICLOFENAC (VOLTAREN) 75 MG EC TABLET    Take 75 mg by mouth 2 (two) times daily. Reported on 09/07/2015    Allergies: No Known Allergies  Past Medical History: Past Medical History:  Diagnosis Date  . ADD (attention deficit disorder)   . Allergy   . Anxiety   .  GERD (gastroesophageal reflux disease)   . Hyperlipidemia   . Hypertension   . Obesity     Social History: Social History   Socioeconomic History  . Marital status: Married    Spouse name: katelin  . Number of children: 1  . Years of education: college  . Highest education level: Not on file  Occupational History  . 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 Financial resource strain: Not on file  . Food insecurity:    Worry: Not on file    Inability: Not on file  . Transportation needs:    Medical: Not on file    Non-medical: Not on file  Tobacco Use  . Smoking status: Never Smoker  . Smokeless tobacco: Never Used  Substance and Sexual Activity  . Alcohol use: No    Alcohol/week: 0.0 standard drinks  . Drug use: No  . Sexual activity: Not Currently  Lifestyle  . Physical activity:    Days per week: Not on file    Minutes per session: Not on file  . Stress: Not on file  Relationships  . Social connections:    Talks on phone: Not on file    Gets together: Not on file    Attends religious service: Not on file    Active  member of club or organization: Not on file    Attends meetings of clubs or organizations: Not on file    Relationship status: Not on file  Other Topics Concern  . Not on file  Social History Narrative  . Not on file    [facility] PREP FLOWSHEET RESULTS 05/05/2018 02/14/2018 01/17/2018  Insurance Status Insured Insured Insured  How did you hear? - Family Physician Referred from Dr Clelia CroftShaw  Gender at birth Male Male Male  Gender identity cis-Male cis-Male cis-Male  Risk for HIV >5 partners in past 6 mos (regardless of condom use);Condomless vaginal or anal intercourse Condomless vaginal or anal intercourse;>5 partners in past 6 mos (regardless of condom use);Hx of STI Condomless vaginal or anal intercourse  Sex Partners Men only Men only Men only  # sex partners past 3-6 mos 4-6 4-6 1-3  Sex activity preferences Insertive and receptive Insertive and  receptive;Oral Insertive and receptive;Oral  Condom use Yes Yes No  % condom use 190 95 -  Partners genders and ages - 78M 20-24;M 25-29;M 4330-49 M 20-24;M 25-29;M 30-49  Treated for STI? No Yes N/A  HIV symptoms? N/A N/A N/A  PrEP Eligibility CrCl >60 ml/min HIV negative;CrCl >60 ml/min;Substantial risk for HIV Substantial risk for HIV;HIV negative  Paper work received? - No No    Labs:  SCr: Lab Results  Component Value Date   CREATININE 0.82 02/14/2018   CREATININE 0.82 01/17/2018   CREATININE 0.83 10/30/2016   CREATININE 0.80 03/08/2016   CREATININE 0.88 12/06/2015   HIV Lab Results  Component Value Date   HIV NON-REACTIVE 02/14/2018   HIV NON-REACTIVE 01/17/2018   HIV NONREACTIVE 10/30/2016   HIV NONREACTIVE 03/08/2016   HIV NONREACTIVE 12/06/2015   Hepatitis B Lab Results  Component Value Date   HEPBSAB NON-REACTIVE 01/17/2018   HEPBSAG NEGATIVE 09/07/2015   Hepatitis C No results found for: HEPCAB, HCVRNAPCRQN Hepatitis A Lab Results  Component Value Date   HAV REACTIVE (A) 01/17/2018   RPR and STI Lab Results  Component Value Date   LABRPR NON-REACTIVE 01/17/2018   LABRPR NON REAC 10/30/2016   LABRPR NON REAC 03/08/2016   LABRPR NON REAC 12/06/2015    STI Results GC CT  02/14/2018 Negative Negative  01/17/2018 Negative Negative  01/17/2018 **POSITIVE**(A) Negative  01/17/2018 Negative Negative  10/30/2016 Negative Negative  10/30/2016 Negative Negative  10/30/2016 Negative Negative  03/08/2016 Negative Negative  12/06/2015 Negative Negative    Assessment: Alexander Solomon is here today to follow-up for PrEP.  He is taking Truvada with no issues.  No missed doses in the last 3 months and no side effects. He has around 6-7 pills left in his current bottle. No issues getting the medication from our pharmacy.  He has had 4-5 partners in the last 3 months with around 90% condom use.  Explained the risk with no condom use and he is aware. He is a versatile partner with oral  involvement as well.  He was tested for STDs at last visit but would like to get re-swabbed today so will check all three sites for him.  He is getting bills for the cytology that his insurance is not covering.  [facility], but he wants to continue getting tested here and he is going to call him insurance company in the meantime. Will check labs today and send in 3 months of Truvada if he remains HIV negative.  No signs/sx of STDs or HIV at  this time.  I gave him his 3rd Hep B vaccine today. Will check another surface ab next time to see if he is adequately covered now.  Plan: - Continue Truvada PO once daily - HIV anitbody, RPR, urine/oral/rectal gc/c swabs - 3 months of Truvada if HIV negative - Hep B vaccine - F/u with me 11/11 at 915am  Cassie L. Kuppelweiser, PharmD, AAHIVP, CPP Infectious Diseases Clinical Pharmacist [facility] 05/05/2018, 11:43 AM

## 2018-05-06 LAB — URINE CYTOLOGY ANCILLARY ONLY
Chlamydia: NEGATIVE
Neisseria Gonorrhea: NEGATIVE

## 2018-05-06 LAB — HIV ANTIBODY (ROUTINE TESTING W REFLEX): HIV 1&2 Ab, 4th Generation: NONREACTIVE

## 2018-05-06 LAB — CYTOLOGY, (ORAL, ANAL, URETHRAL) ANCILLARY ONLY
Chlamydia: NEGATIVE
Chlamydia: NEGATIVE
Neisseria Gonorrhea: NEGATIVE
Neisseria Gonorrhea: NEGATIVE

## 2018-05-06 LAB — RPR: RPR Ser Ql: NONREACTIVE

## 2018-05-07 ENCOUNTER — Telehealth: Payer: Self-pay | Admitting: Pharmacist

## 2018-05-07 DIAGNOSIS — Z7252 High risk homosexual behavior: Secondary | ICD-10-CM

## 2018-05-07 MED ORDER — EMTRICITABINE-TENOFOVIR DF 200-300 MG PO TABS: 1.0000 | ORAL_TABLET | Freq: Every day | ORAL | 2 refills | Status: DC

## 2018-05-07 NOTE — Telephone Encounter (Signed)
Called Alexander Solomon to let him know that his HIV antibody and STD swabs were all negative.  [facility].

## 2018-05-08 MED FILL — TRUVADA 200-300 MG TABS: 200-300 | 30 days supply | Qty: 30 | Fill #0

## 2018-06-19 MED FILL — TRUVADA 200-300 MG TABS: 200-300 | 30 days supply | Qty: 30 | Fill #1

## 2018-08-04 ENCOUNTER — Ambulatory Visit: Payer: Self-pay

## 2019-08-31 MED FILL — OMEPRAZOLE DR 40 MG CAPSULE: 40 | 90 days supply | Qty: 90 | Fill #0

## 2019-08-31 MED FILL — LOSARTAN-HCTZ 100-25 MG TAB: 100-25 | 90 days supply | Qty: 45 | Fill #0

## 2019-08-31 MED FILL — SIMVASTATIN 40 MG TABLET: 40 | 90 days supply | Qty: 90 | Fill #0

## 2019-10-12 MED FILL — CYCLOBENZAPRINE 10 MG TAB: 10 | 10 days supply | Qty: 30 | Fill #0

## 2019-10-12 MED FILL — FLUTICASONE PROP 50 MCG SPR: 50 | 90 days supply | Qty: 48 | Fill #0

## 2019-11-19 MED FILL — OMEPRAZOLE DR 40 MG CAPSULE: 40 | 90 days supply | Qty: 90 | Fill #1

## 2019-11-19 MED FILL — LOSARTAN-HCTZ 100-25 MG TAB: 100-25 | 90 days supply | Qty: 45 | Fill #1

## 2019-11-19 MED FILL — SIMVASTATIN 40 MG TABLET: 40 | 90 days supply | Qty: 90 | Fill #1

## 2020-03-07 ENCOUNTER — Encounter: Payer: Self-pay | Admitting: General Practice

## 2020-03-14 ENCOUNTER — Ambulatory Visit: Payer: Managed Care, Other (non HMO) | Admitting: Cardiovascular Disease

## 2020-03-14 ENCOUNTER — Other Ambulatory Visit: Payer: Self-pay

## 2020-03-14 VITALS — BP 124/86 | HR 94 | Ht 72.0 in | Wt 368.4 lb

## 2020-03-14 DIAGNOSIS — G4733 Obstructive sleep apnea (adult) (pediatric): Secondary | ICD-10-CM | POA: Diagnosis not present

## 2020-03-14 DIAGNOSIS — R0600 Dyspnea, unspecified: Secondary | ICD-10-CM

## 2020-03-14 DIAGNOSIS — R0609 Other forms of dyspnea: Secondary | ICD-10-CM

## 2020-03-14 NOTE — Progress Notes (Signed)
Cardiology Office Note:    Date:  03/14/2020   ID:  [name], DOB Sep 24, 1986, MRN [id number]  PCP:  Alexander Neer, MD  [facility] Cardiologist:   [facility] Electrophysiologist:  None   Referring MD: Alexander Neer, MD   Chief Complaint  Patient presents with  . Shortness of Breath    History of Present Illness:    Alexander Solomon is a 34 y.o. male with a hx of hypertension, hyperlipidemia, morbid obesity, obstructive sleep apnea.  We are asked to see him today by Dr. Mayra Solomon for further evaluation of his shortness of breath with exertion.  Has developed more DOE with chest pressure over the past several months .  Has gained weight over this past year with covid.  Has gained 30 lbs over the past year Is back working now , is a Wellsite geologist for a [employer]  No real angina   Has gone on a diet.   Has been referred to the [facility] .    Past Medical History:  Diagnosis Date  . Acute pain of right knee 11/05/2016  . ADD (attention deficit disorder)   . Allergy   . Anxiety   . Attention deficit disorder 09/07/2015  . Benign essential HTN 05/04/2015  . Chest pain 05/04/2015  . Excessive daytime sleepiness 05/04/2015  . GERD (gastroesophageal reflux disease)   . High risk sexual behavior 09/07/2015  . Hyperlipidemia 05/04/2015  . Hypertension   . Morbid obesity ([place]) 09/07/2015  . Obesity   . Obstructive sleep apnea 05/04/2015  . PVC (premature ventricular contraction) 05/04/2015  . Seasonal allergies 09/07/2015    Past Surgical History:  Procedure Laterality Date  . KNEE SURGERY Bilateral     Current Medications: Current Meds  Medication Sig  . cyclobenzaprine (FLEXERIL) 10 MG tablet Take 10 mg by mouth 3 (three) times daily as needed for muscle spasms. Reported on 09/07/2015  . diclofenac (VOLTAREN) 50 MG EC tablet Take 50 mg by mouth 2 (two) times daily as needed.  . fluticasone (FLONASE) 50 MCG/ACT  nasal spray Place 2 sprays into both nostrils daily.  Marland Kitchen lisdexamfetamine (VYVANSE) 20 MG capsule Take 60 mg by mouth daily.  Marland Kitchen loratadine (CLARITIN) 10 MG tablet Take 10 mg by mouth daily.  Marland Kitchen losartan-hydrochlorothiazide (HYZAAR) 100-25 MG per tablet Take 1 tablet by mouth daily.  . Multiple Vitamin (MULTIVITAMIN) tablet Take 1 tablet by mouth daily.  Marland Kitchen omeprazole (PRILOSEC) 40 MG capsule Take 40 mg by mouth every evening.   . Semaglutide (RYBELSUS) 3 MG TABS Take 4 tablets by mouth daily.  . simvastatin (ZOCOR) 40 MG tablet Take 40 mg by mouth daily.  . sucralfate (CARAFATE) 1 g tablet Take 1 tablet by mouth 3 (three) times daily.     Allergies:   Patient has no known allergies.   Social History   Socioeconomic History  . Marital status: Married    Spouse name: Alexander Solomon  . Number of children: 1  . Years of education: college  . Highest education level: Not on file  Occupational History  . 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 Smoking status: Never Smoker  . Smokeless tobacco: Never Used  Substance and Sexual Activity  . Alcohol use: No    Alcohol/week: 0.0 standard drinks  . Drug use: No  . Sexual activity: Not Currently  Other Topics Concern  . Not on file  Social History Narrative  . Not on file  Social Determinants of Health   Financial Resource Strain:   . Difficulty of Paying Living Expenses:   Food Insecurity:   . Worried About Programme researcher, broadcasting/film/video in the Last Year:   . Barista in the Last Year:   Transportation Needs:   . Freight forwarder (Medical):   Marland Kitchen Lack of Transportation (Non-Medical):   Physical Activity:   . Days of Exercise per Week:   . Minutes of Exercise per Session:   Stress:   . Feeling of Stress :   Social Connections:   . Frequency of Communication with Friends and Family:   . Frequency of Social Gatherings with Friends and Family:   . Attends Religious Services:   . Active Member of Clubs or Organizations:   . Attends  Banker Meetings:   Marland Kitchen Marital Status:      Family History: The patient's family history includes Diabetes in his mother; Heart attack in his father; Heart disease in his father; Hypertension in his mother.  ROS:   Please see the history of present illness.     All other systems reviewed and are negative.  EKGs/Labs/Other Studies Reviewed:    The following studies were reviewed today:   EKG:   March 14, 2020: Normal sinus rhythm at 94.  No ST or T wave changes.  Recent Labs: No results found for requested labs within last 8760 hours.  Recent Lipid Panel No results found for: CHOL, TRIG, HDL, CHOLHDL, VLDL, LDLCALC, LDLDIRECT  Physical Exam:    VS:  BP 124/86   Pulse 94   Ht 6' (1.829 m)   Wt (!) 368 lb 6.4 oz (167.1 kg)   SpO2 96%   BMI 49.96 kg/m     Wt Readings from Last 3 Encounters:  03/14/20 (!) 368 lb 6.4 oz (167.1 kg)  03/08/16 (!) 324 lb (147 kg)  12/06/15 (!) 336 lb (152.4 kg)     GEN: morbidly obese male, NAD  HEENT: Normal NECK: No JVD; No carotid bruits LYMPHATICS: No lymphadenopathy CARDIAC: RRR, no murmurs, rubs, gallops RESPIRATORY:  Clear to auscultation without rales, wheezing or rhonchi  ABDOMEN: Soft, non-tender, non-distended MUSCULOSKELETAL:  No edema; No deformity  SKIN: Warm and dry NEUROLOGIC:  Alert and oriented x 3 PSYCHIATRIC:  Normal affect   ASSESSMENT:    1. Obstructive sleep apnea   2. Morbid obesity (HCC)   3. DOE (dyspnea on exertion)    PLAN:    In order of problems listed above:  1. Dyspnea with exertion: Alexander Solomon presents with worsening dyspnea on exertion.  I suspect that his issue is his morbid obesity and generalized deconditioning.  He admits that he is gained a lot of weight over Covid and has not been exercising.  I think this is his primary issue.  We discussed possibly doing a treadmill test but at this point I think that his main focus should be on diet, exercise, weight loss.  I think that he will  feel quite a bit better even with 10 to 15 pounds weight loss.  Of asked him to start exercising and to work up to walking 2 to 3 miles a day 3 to 4 days a week.   Medication Adjustments/Labs and Tests Ordered: Current medicines are reviewed at length with the patient today.  Concerns regarding medicines are outlined above.  Orders Placed This Encounter  Procedures  . EKG 12-Lead   No orders of the defined types were placed in this  encounter.   Patient Instructions  Medication Instructions:  Your physician recommends that you continue on your current medications as directed. Please refer to the Current Medication list given to you today.  *If you need a refill on your cardiac medications before your next appointment, please call your pharmacy*   Lab Work: None Ordered If you have labs (blood work) drawn today and your tests are completely normal, you will receive your results only by: Marland Kitchen MyChart Message (if you have MyChart) OR . A paper copy in the mail If you have any lab test that is abnormal or we need to change your treatment, we will call you to review the results.   Testing/Procedures: None Ordered   Follow-Up: At [facility], you and your health needs are our priority.  As part of our continuing mission to provide you with exceptional heart care, we have created designated Provider Care Teams.  These Care Teams include your primary Cardiologist (physician) and Advanced Practice Providers (APPs -  Physician Assistants and Nurse Practitioners) who all work together to provide you with the care you need, when you need it.   Your next appointment:    As Needed  The format for your next appointment:   Either In Person or Virtual  Provider:   You may see Kristeen Miss, MD or one of the following Advanced Practice Providers on your designated Care Team:    Tereso Newcomer, PA-C  Chelsea Aus, [place]         Signed, Kristeen Miss, MD  03/14/2020 3:29 PM    [facility]

## 2020-03-14 NOTE — Patient Instructions (Signed)
Medication Instructions:  Your physician recommends that you continue on your current medications as directed. Please refer to the Current Medication list given to you today.  *If you need a refill on your cardiac medications before your next appointment, please call your pharmacy*   Lab Work: None Ordered If you have labs (blood work) drawn today and your tests are completely normal, you will receive your results only by: . MyChart Message (if you have MyChart) OR . A paper copy in the mail If you have any lab test that is abnormal or we need to change your treatment, we will call you to review the results.   Testing/Procedures: None Ordered   Follow-Up: At [facility], you and your health needs are our priority.  As part of our continuing mission to provide you with exceptional heart care, we have created designated Provider Care Teams.  These Care Teams include your primary Cardiologist (physician) and Advanced Practice Providers (APPs -  Physician Assistants and Nurse Practitioners) who all work together to provide you with the care you need, when you need it.   Your next appointment:    As Needed  The format for your next appointment:   Either In Person or Virtual  Provider:   You may see Philip Nahser, MD or one of the following Advanced Practice Providers on your designated Care Team:    Scott Weaver, PA-C  Vin Bhagat, PA-C     

## 2020-03-21 ENCOUNTER — Encounter (INDEPENDENT_AMBULATORY_CARE_PROVIDER_SITE_OTHER): Payer: Self-pay

## 2020-07-13 ENCOUNTER — Encounter (INDEPENDENT_AMBULATORY_CARE_PROVIDER_SITE_OTHER): Payer: Self-pay | Admitting: Bariatrics

## 2020-07-13 ENCOUNTER — Ambulatory Visit (INDEPENDENT_AMBULATORY_CARE_PROVIDER_SITE_OTHER): Payer: Managed Care, Other (non HMO) | Admitting: Bariatrics

## 2020-07-13 ENCOUNTER — Other Ambulatory Visit: Payer: Self-pay

## 2020-07-13 VITALS — BP 128/77 | HR 84 | Temp 98.6°F | Ht 71.0 in | Wt 352.0 lb

## 2020-07-13 DIAGNOSIS — G4733 Obstructive sleep apnea (adult) (pediatric): Secondary | ICD-10-CM | POA: Diagnosis not present

## 2020-07-13 DIAGNOSIS — I10 Essential (primary) hypertension: Secondary | ICD-10-CM | POA: Diagnosis not present

## 2020-07-13 DIAGNOSIS — E559 Vitamin D deficiency, unspecified: Secondary | ICD-10-CM | POA: Diagnosis not present

## 2020-07-13 DIAGNOSIS — Z9189 Other specified personal risk factors, not elsewhere classified: Secondary | ICD-10-CM

## 2020-07-13 DIAGNOSIS — Z1331 Encounter for screening for depression: Secondary | ICD-10-CM

## 2020-07-13 DIAGNOSIS — R5383 Other fatigue: Secondary | ICD-10-CM

## 2020-07-13 DIAGNOSIS — Z0289 Encounter for other administrative examinations: Secondary | ICD-10-CM

## 2020-07-13 DIAGNOSIS — R7303 Prediabetes: Secondary | ICD-10-CM

## 2020-07-13 DIAGNOSIS — K219 Gastro-esophageal reflux disease without esophagitis: Secondary | ICD-10-CM

## 2020-07-13 DIAGNOSIS — E7849 Other hyperlipidemia: Secondary | ICD-10-CM

## 2020-07-13 DIAGNOSIS — R0602 Shortness of breath: Secondary | ICD-10-CM

## 2020-07-13 DIAGNOSIS — F3289 Other specified depressive episodes: Secondary | ICD-10-CM

## 2020-07-13 DIAGNOSIS — F988 Other specified behavioral and emotional disorders with onset usually occurring in childhood and adolescence: Secondary | ICD-10-CM

## 2020-07-13 DIAGNOSIS — Z6841 Body Mass Index (BMI) 40.0 and over, adult: Secondary | ICD-10-CM

## 2020-07-14 ENCOUNTER — Encounter (INDEPENDENT_AMBULATORY_CARE_PROVIDER_SITE_OTHER): Payer: Self-pay | Admitting: Bariatrics

## 2020-07-14 DIAGNOSIS — E559 Vitamin D deficiency, unspecified: Secondary | ICD-10-CM | POA: Insufficient documentation

## 2020-07-14 DIAGNOSIS — R7303 Prediabetes: Secondary | ICD-10-CM | POA: Insufficient documentation

## 2020-07-14 LAB — COMPREHENSIVE METABOLIC PANEL
ALT: 37 IU/L (ref 0–44)
AST: 21 IU/L (ref 0–40)
Albumin/Globulin Ratio: 2 (ref 1.2–2.2)
Albumin: 4.6 g/dL (ref 4.0–5.0)
Alkaline Phosphatase: 82 IU/L (ref 44–121)
BUN/Creatinine Ratio: 21 — ABNORMAL HIGH (ref 9–20)
BUN: 15 mg/dL (ref 6–20)
Bilirubin Total: 0.3 mg/dL (ref 0.0–1.2)
CO2: 24 mmol/L (ref 20–29)
Calcium: 9.3 mg/dL (ref 8.7–10.2)
Chloride: 102 mmol/L (ref 96–106)
Creatinine, Ser: 0.73 mg/dL — ABNORMAL LOW (ref 0.76–1.27)
GFR calc Af Amer: 138 mL/min/{1.73_m2} (ref >59)
GFR calc non Af Amer: 119 mL/min/{1.73_m2} (ref >59)
Globulin, Total: 2.3 g/dL (ref 1.5–4.5)
Glucose: 93 mg/dL (ref 65–99)
Potassium: 3.9 mmol/L (ref 3.5–5.2)
Sodium: 141 mmol/L (ref 134–144)
Total Protein: 6.9 g/dL (ref 6.0–8.5)

## 2020-07-14 LAB — T3: T3, Total: 149 ng/dL (ref 71–180)

## 2020-07-14 LAB — LIPID PANEL WITH LDL/HDL RATIO
Cholesterol, Total: 154 mg/dL (ref 100–199)
HDL: 43 mg/dL (ref >39)
LDL Chol Calc (NIH): 90 mg/dL (ref 0–99)
LDL/HDL Ratio: 2.1 ratio (ref 0.0–3.6)
Triglycerides: 116 mg/dL (ref 0–149)
VLDL Cholesterol Cal: 21 mg/dL (ref 5–40)

## 2020-07-14 LAB — HEMOGLOBIN A1C
Est. average glucose Bld gHb Est-mCnc: 134 mg/dL
Hgb A1c MFr Bld: 6.3 % — ABNORMAL HIGH (ref 4.8–5.6)

## 2020-07-14 LAB — TSH: TSH: 1.26 u[IU]/mL (ref 0.450–4.500)

## 2020-07-14 LAB — INSULIN, RANDOM: INSULIN: 54.1 u[IU]/mL — ABNORMAL HIGH (ref 2.6–24.9)

## 2020-07-14 LAB — VITAMIN D 25 HYDROXY (VIT D DEFICIENCY, FRACTURES): Vit D, 25-Hydroxy: 29.7 ng/mL — ABNORMAL LOW (ref 30.0–100.0)

## 2020-07-14 LAB — T4, FREE: Free T4: 1.03 ng/dL (ref 0.82–1.77)

## 2020-07-14 NOTE — Progress Notes (Signed)
Dear Dr. Lupita Solomon Solomon,   Thank you for referring Alexander Solomon to our clinic. The following note includes my evaluation and treatment recommendations.  Chief Complaint:   OBESITY Alexander Solomon (MR# [id number]) is a 34 y.o. male who presents for evaluation and treatment of obesity and related comorbidities. Current BMI is Body mass index is 49.09 kg/m.Marland Kitchen. Alexander Solomon has been struggling with his weight for many years and has been unsuccessful in either losing weight, maintaining weight loss, or reaching his healthy weight goal.  Alexander Solomon is currently in the action stage of change and ready to dedicate time achieving and maintaining a healthier weight. Alexander Solomon is interested in becoming our patient and working on intensive lifestyle modifications including (but not limited to) diet and exercise for weight loss.  Alexander Solomon eats outside the home 9 times or more a week. He likes to cook. He reports having more cravings in the afternoon and evening. He skips lunch.  Alexander Solomon's habits were reviewed today and are as follows: His family eats meals together, he thinks his family will eat healthier with him, his desired weight loss is 127 lbs, he has been heavy most of his life, he started gaining weight in 2006, his heaviest weight ever was 365 pounds, he craves meat, cheese, and pasta, he snacks frequently in the evenings, he skips lunch 3 times a week, he is frequently drinking liquids with calories, he frequently makes poor food choices, he has problems with excessive hunger, he frequently eats larger portions than normal, he has binge eating behaviors and he struggles with emotional eating.  Depression Screen Alexander Solomon Food and Mood (modified PHQ-9) score was 24.  Depression screen PHQ 2/9 07/13/2020  Decreased Interest 3  Down, Depressed, Hopeless 3  PHQ - 2 Score 6  Altered sleeping 3  Tired, decreased energy 3  Change in appetite 3  Feeling bad or failure about yourself  3  Trouble concentrating 3  Moving  slowly or fidgety/restless 3  Suicidal thoughts 0  PHQ-9 Score 24  Difficult doing work/chores Extremely dIfficult   Subjective:   Other fatigue. Alexander Solomon denies daytime somnolence and admits to waking up still tired. Patent has a history of symptoms of morning headache. Alexander Solomon generally gets 6-8 hours of sleep per night, and states that he has generally restful sleep. Snoring is present. Apneic episodes are not present. Epworth Sleepiness Score is 8.  SOB (shortness of breath) on exertion. Alexander Solomon notes increasing shortness of breath with exercising and seems to be worsening over time with weight gain. He notes getting out of breath sooner with activity than he used to. This has gotten worse recently. Alexander Solomon denies shortness of breath at rest or orthopnea.  Essential hypertension. Alexander Solomon is taking Losartan/HCTZ.  BP Readings from Last 3 Encounters:  07/13/20 128/77  03/14/20 124/86  03/08/16 126/84   Lab Results  Component Value Date   CREATININE 0.82 02/14/2018   CREATININE 0.82 01/17/2018   OSA (obstructive sleep apnea). Alexander Solomon reports wearing CPAP.  Gastroesophageal reflux disease, unspecified whether esophagitis present. Alexander Solomon is taking omeprazole for GERD symptoms.  Other hyperlipidemia. Alexander Solomon has hyperlipidemia and has been trying to improve his cholesterol levels with intensive lifestyle modification including a low saturated fat diet, exercise and weight loss. He denies any chest pain, claudication or myalgias. Alexander Solomon is taking simvastatin.  Prediabetes. Alexander Solomon has a diagnosis of prediabetes based on his elevated HgA1c and was informed this puts him at greater risk of developing diabetes. He continues to work on diet  and exercise to decrease his risk of diabetes. He denies nausea or hypoglycemia. Alexander Solomon is taking Rybelsus. A1c 6.8.  Attention deficit disorder, unspecified hyperactivity presence. Alexander Solomon is taking Vyvanse.  Vitamin D deficiency. Alexander Solomon is not on Vitamin D supplementation at  this time.  Other depression, with emotional eating. Alexander Solomon is struggling with emotional eating and using food for comfort to the extent that it is negatively impacting his health. He has been working on behavior modification techniques to help reduce his emotional eating and has been somewhat successful. He shows no sign of suicidal or homicidal ideations. PHQ-9 score is 24.  At risk of diabetes mellitus. Alexander Solomon is at higher than average risk for developing diabetes due to prediabetes and obesity.   Assessment/Plan:   Other fatigue. [name] does feel that his weight is causing his energy to be lower than it should be. Fatigue may be related to obesity, depression or many other causes. Labs will be ordered, and in the meanwhile, [name] will focus on self care including making healthy food choices, increasing physical activity and focusing on stress reduction. Hemoglobin A1c, Insulin, random, Lipid Panel With LDL/HDL Ratio, T3, T4, free, TSH, VITAMIN D 25 Hydroxy (Vit-D Deficiency, Fractures) tests ordered today.  SOB (shortness of breath) on exertion. [name] does feel that he gets out of breath more easily that he used to when he exercises. [name]'s shortness of breath appears to be obesity related and exercise induced. He has agreed to work on weight loss and gradually increase exercise to treat his exercise induced shortness of breath. Will continue to monitor closely.   Essential hypertension. Alexander Solomon is working on healthy weight loss and exercise to improve blood pressure control. We will watch for signs of hypotension as he continues his lifestyle modifications. Comprehensive metabolic panel will be checked today.  OSA (obstructive sleep apnea). Intensive lifestyle modifications are the first line treatment for this issue. We discussed several lifestyle modifications today and he will continue to work on diet, exercise and weight loss efforts. We will continue to monitor. Orders and follow up as documented  in patient record. Alexander Solomon will continue using his CPAP nightly.  Gastroesophageal reflux disease, unspecified whether esophagitis present. Intensive lifestyle modifications are the first line treatment for this issue. We discussed several lifestyle modifications today and he will continue to work on diet, exercise and weight loss efforts. Orders and follow up as documented in patient record. Alexander Solomon will continue omeprazole as directed.   Counseling . If a person has gastroesophageal reflux disease (GERD), food and stomach acid move back up into the esophagus and cause symptoms or problems such as damage to the esophagus. . Anti-reflux measures include: raising the head of the bed, avoiding tight clothing or belts, avoiding eating late at night, not lying down shortly after mealtime, and achieving weight loss. . Avoid ASA, NSAID's, caffeine, alcohol, and tobacco.  . OTC Pepcid and/or Tums are often very helpful for as needed use.  Marland Kitchen However, for persisting chronic or daily symptoms, stronger medications like Omeprazole may be needed. . You may need to avoid foods and drinks such as: ? Coffee and tea (with or without caffeine). ? Drinks that contain alcohol. ? Energy drinks and sports drinks. ? Bubbly (carbonated) drinks or sodas. ? Chocolate and cocoa. ? Peppermint and mint flavorings. ? Garlic and onions. ? Horseradish. ? Spicy and acidic foods. These include peppers, chili powder, curry powder, vinegar, hot sauces, and BBQ sauce. ? Citrus fruit juices and citrus fruits, such as oranges,  lemons, and limes. ? Tomato-based foods. These include red sauce, chili, salsa, and pizza with red sauce. ? Fried and fatty foods. These include donuts, french fries, potato chips, and high-fat dressings. ? High-fat meats. These include hot dogs, rib eye steak, sausage, ham, and bacon.  Other hyperlipidemia. Cardiovascular risk and specific lipid/LDL goals reviewed.  We discussed several lifestyle  modifications today and Alexander Solomon will continue to work on diet, exercise and weight loss efforts. Orders and follow up as documented in patient record. Lipid Panel With LDL/HDL Ratio will be checked today.  Counseling Intensive lifestyle modifications are the first line treatment for this issue. . Dietary changes: Increase soluble fiber. Decrease simple carbohydrates. . Exercise changes: Moderate to vigorous-intensity aerobic activity 150 minutes per week if tolerated. . Lipid-lowering medications: see documented in medical record.   Prediabetes. Alexander Solomon will continue to work on weight loss, exercise, and decreasing simple carbohydrates to help decrease the risk of diabetes. He will continue Rybelsus as directed.  Hemoglobin A1c, Insulin, random labs will be checked today.   Attention deficit disorder, unspecified hyperactivity presence. Alexander Solomon will continue  Vyvanse as directed.   Vitamin D deficiency. Low Vitamin D level contributes to fatigue and are associated with obesity, breast, and colon cancer. VITAMIN D 25 Hydroxy (Vit-D Deficiency, Fractures) level will be checked today.   Other depression, with emotional eating. Behavior modification techniques were discussed today to help Alexander Solomon deal with his emotional/non-hunger eating behaviors.  Orders and follow up as documented in patient record. We discussed CBT techniques.  At risk of diabetes mellitus. Alexander Solomon was given approximately 15 minutes of diabetes education and counseling today. We discussed intensive lifestyle modifications today with an emphasis on weight loss as well as increasing exercise and decreasing simple carbohydrates in his diet. We also reviewed medication options with an emphasis on risk versus benefit of those discussed.   Repetitive spaced learning was employed today to elicit superior memory formation and behavioral change.  Class 3 severe obesity with serious comorbidity and body mass index (BMI) of 45.0 to 49.9 in adult,  unspecified obesity type (HCC).  Alexander Solomon is currently in the action stage of change and his goal is to continue with weight loss efforts. I recommend Alexander Solomon begin the structured treatment plan as follows:  He has agreed to the Category 4 Plan.   He will work on meal planning, intentional eating, decreasing portion sizes, and not skipping lunch.  We reviewed with the patient labs from May 2021 including CMP, lipids, and A1c.  Exercise goals: All adults should avoid inactivity. Some physical activity is better than none, and adults who participate in any amount of physical activity gain some health benefits.   Behavioral modification strategies: increasing lean protein intake, decreasing simple carbohydrates, increasing vegetables, increasing water intake, decreasing eating out, no skipping meals, meal planning and cooking strategies, keeping healthy foods in the home and planning for success.  He was informed of the importance of frequent follow-up visits to maximize his success with intensive lifestyle modifications for his multiple health conditions. He was informed we would discuss his lab results at his next visit unless there is a critical issue that needs to be addressed sooner. Alexander Solomon agreed to keep his next visit at the agreed upon time to discuss these results.  Objective:   Blood pressure 128/77, pulse 84, temperature 98.6 F (37 C), height 5\' 11"  (1.803 m), weight (!) 352 lb (159.7 kg), SpO2 97 %. Body mass index is 49.09 kg/m.  EKG: Sinus  Rhythm with a rate of 87 BPM. RSR(V1) - nondiagnostic. Probably normal.  Indirect Calorimeter completed today shows a VO2 of 526 and a REE of 3652.  His calculated basal metabolic rate is 8299 thus his basal metabolic rate is better than expected.  General: Cooperative, alert, well developed, in no acute distress. HEENT: Conjunctivae and lids unremarkable. Cardiovascular: Regular rhythm.  Lungs: Normal work of breathing. Neurologic: No focal  deficits.   Attestation Statements:   Reviewed by clinician on day of visit: allergies, medications, problem list, medical history, surgical history, family history, social history, and previous encounter notes.  Fernanda Drum, am acting as Energy manager for [employer], DO   I have reviewed the above documentation for accuracy and completeness, and I agree with the above. Corinna Capra, DO

## 2020-07-25 ENCOUNTER — Encounter (INDEPENDENT_AMBULATORY_CARE_PROVIDER_SITE_OTHER): Payer: Self-pay | Admitting: Bariatrics

## 2020-07-27 ENCOUNTER — Other Ambulatory Visit: Payer: Self-pay

## 2020-07-27 ENCOUNTER — Encounter (INDEPENDENT_AMBULATORY_CARE_PROVIDER_SITE_OTHER): Payer: Self-pay | Admitting: Bariatrics

## 2020-07-27 ENCOUNTER — Ambulatory Visit (INDEPENDENT_AMBULATORY_CARE_PROVIDER_SITE_OTHER): Payer: Managed Care, Other (non HMO) | Admitting: Bariatrics

## 2020-07-27 VITALS — BP 149/89 | HR 97 | Temp 98.6°F | Ht 71.0 in | Wt 346.0 lb

## 2020-07-27 DIAGNOSIS — R7303 Prediabetes: Secondary | ICD-10-CM

## 2020-07-27 DIAGNOSIS — Z6841 Body Mass Index (BMI) 40.0 and over, adult: Secondary | ICD-10-CM

## 2020-07-27 DIAGNOSIS — E7849 Other hyperlipidemia: Secondary | ICD-10-CM | POA: Diagnosis not present

## 2020-07-27 DIAGNOSIS — E559 Vitamin D deficiency, unspecified: Secondary | ICD-10-CM

## 2020-07-27 DIAGNOSIS — E66813 Obesity, class 3: Secondary | ICD-10-CM

## 2020-07-27 DIAGNOSIS — Z9189 Other specified personal risk factors, not elsewhere classified: Secondary | ICD-10-CM | POA: Diagnosis not present

## 2020-07-27 MED ORDER — METFORMIN HCL 500 MG PO TABS: 500.0000 mg | ORAL_TABLET | Freq: Every day | ORAL | 0 refills | Status: DC

## 2020-07-27 MED ORDER — VITAMIN D (ERGOCALCIFEROL) 1.25 MG (50000 UNIT) PO CAPS: 50000.0000 [IU] | ORAL_CAPSULE | ORAL | 0 refills | Status: AC

## 2020-07-27 NOTE — Progress Notes (Signed)
Chief Complaint:   Alexander Solomon is here to discuss his progress with his Alexander treatment plan along with follow-up of his Alexander related diagnoses. Alexander Solomon is on the Category 4 Plan and states he is following his eating plan approximately 30% of the time. Alexander Solomon states he is walking/moving boxes 2 hours 4-5 times per week.  Today's visit was #: 2 Starting weight: 352 lbs Starting date: 07/13/2020 Today's weight: 346 lbs Today's date: 07/27/2020 Total lbs lost to date: 6 Total lbs lost since last in-office visit: 6  Interim History: Alexander Solomon is down 6 lbs, but has not followed the plan closely secondary to increased stress and obligations (moving into a new house).  Subjective:   Vitamin D deficiency. Alexander Solomon is not on Vitamin D supplementation.    Ref. Range 07/13/2020 11:18  Vitamin D, 25-Hydroxy Latest Ref Range: 30.0 - 100.0 ng/mL 29.7 (L)   Prediabetes. Alexander Solomon has a diagnosis of prediabetes based on his elevated HgA1c and was informed this puts him at greater risk of developing diabetes. He continues to work on diet and exercise to decrease his risk of diabetes. He denies nausea or hypoglycemia. Alexander Solomon is taking Rybelsus and reports increased appetite.  Lab Results  Component Value Date   HGBA1C 6.3 (H) 07/13/2020   Lab Results  Component Value Date   INSULIN 54.1 (H) 07/13/2020   Other hyperlipidemia. Alexander Solomon has hyperlipidemia and has been trying to improve his cholesterol levels with intensive lifestyle modification including a low saturated fat diet, exercise and weight loss. He denies any chest pain, claudication or myalgias. Alexander Solomon is on Zocor. Levels are controlled.  Lab Results  Component Value Date   ALT 37 07/13/2020   AST 21 07/13/2020   ALKPHOS 82 07/13/2020   BILITOT 0.3 07/13/2020   Lab Results  Component Value Date   CHOL 154 07/13/2020   HDL 43 07/13/2020   LDLCALC 90 07/13/2020   TRIG 116 07/13/2020   At risk for hypoglycemia. Alexander Solomon is at  increased risk for hypoglycemia due to prediabetes.  Assessment/Plan:   Vitamin D deficiency. Low Vitamin D level contributes to fatigue and are associated with Alexander, breast, and colon cancer. He was given a prescription for Vitamin D, Ergocalciferol, (DRISDOL) 1.25 MG (50000 UNIT) CAPS capsule every week #4 with 0 refills and will follow-up for routine testing of Vitamin D, at least 2-3 times per year to avoid over-replacement.   Prediabetes. Alexander Solomon will continue to work on weight loss, increasing activity, and decreasing simple carbohydrates and refined sugars to help decrease the risk of diabetes. Prescription was given for metFORMIN (GLUCOPHAGE) 500 MG tablet 1 PO with evening meal #30 with 0 refills.  Other hyperlipidemia. Cardiovascular risk and specific lipid/LDL goals reviewed.  We discussed several lifestyle modifications today and Alexander Solomon will continue to work on diet, exercise and weight loss efforts. Orders and follow up as documented in patient record. He will continue Zocor as directed.   Counseling Intensive lifestyle modifications are the first line treatment for this issue. . Dietary changes: Increase soluble fiber. Decrease simple carbohydrates. . Exercise changes: Moderate to vigorous-intensity aerobic activity 150 minutes per week if tolerated. . Lipid-lowering medications: see documented in medical record.  At risk for hypoglycemia. Alexander Solomon was given approximately 15 minutes of counseling today regarding prevention of hypoglycemia. He was advised of symptoms of hypoglycemia. Alexander Solomon was instructed to avoid skipping meals, eat regular protein rich meals and schedule low calorie snacks as needed.   Repetitive spaced learning  was employed today to elicit superior memory formation and behavioral change.  Class 3 severe Alexander with serious comorbidity and body mass index (BMI) of 45.0 to 49.9 in adult, unspecified Alexander type (HCC).  Alexander Solomon is currently in the action stage of  change. As such, his goal is to continue with weight loss efforts. He has agreed to the Category 4 Plan.   He will work on meal planning, intentional eating, adhering more closely to the plan, and increasing his water intake.  We reviewed with the patient labs from 07/13/2020 including CMP, lipids, Vitamin D, A1c, insulin, and thyroid panel.  Exercise goals: Alexander Solomon has been moving more - walking, stationary bike, "water rower."  Behavioral modification strategies: increasing lean protein intake, decreasing simple carbohydrates, increasing vegetables, increasing water intake, decreasing eating out, no skipping meals, meal planning and cooking strategies, keeping healthy foods in the home and planning for success.  Alexander Solomon has agreed to follow-up with our clinic in 2 weeks. He was informed of the importance of frequent follow-up visits to maximize his success with intensive lifestyle modifications for his multiple health conditions.   Objective:   Blood pressure (!) 149/89, pulse 97, temperature 98.6 F (37 C), height 5\' 11"  (1.803 m), weight (!) 346 lb (156.9 kg), SpO2 98 %. Body mass index is 48.26 kg/m.  General: Cooperative, alert, well developed, in no acute distress. HEENT: Conjunctivae and lids unremarkable. Cardiovascular: Regular rhythm.  Lungs: Normal work of breathing. Neurologic: No focal deficits.   Lab Results  Component Value Date   CREATININE 0.73 (L) 07/13/2020   BUN 15 07/13/2020   NA 141 07/13/2020   K 3.9 07/13/2020   CL 102 07/13/2020   CO2 24 07/13/2020   Lab Results  Component Value Date   ALT 37 07/13/2020   AST 21 07/13/2020   ALKPHOS 82 07/13/2020   BILITOT 0.3 07/13/2020   Lab Results  Component Value Date   HGBA1C 6.3 (H) 07/13/2020   Lab Results  Component Value Date   INSULIN 54.1 (H) 07/13/2020   Lab Results  Component Value Date   TSH 1.260 07/13/2020   Lab Results  Component Value Date   CHOL 154 07/13/2020   HDL 43 07/13/2020    LDLCALC 90 07/13/2020   TRIG 116 07/13/2020   No results found for: WBC, HGB, HCT, MCV, PLT No results found for: IRON, TIBC, FERRITIN  Attestation Statements:   Reviewed by clinician on day of visit: allergies, medications, problem list, medical history, surgical history, family history, social history, and previous encounter notes.  07/15/2020, am acting as Fernanda Drum for Energy manager, DO   I have reviewed the above documentation for accuracy and completeness, and I agree with the above. [employer], DO

## 2020-07-28 ENCOUNTER — Encounter (INDEPENDENT_AMBULATORY_CARE_PROVIDER_SITE_OTHER): Payer: Self-pay | Admitting: Bariatrics

## 2020-08-10 ENCOUNTER — Ambulatory Visit (INDEPENDENT_AMBULATORY_CARE_PROVIDER_SITE_OTHER): Payer: Managed Care, Other (non HMO) | Admitting: Bariatrics

## 2020-08-10 ENCOUNTER — Encounter (INDEPENDENT_AMBULATORY_CARE_PROVIDER_SITE_OTHER): Payer: Self-pay | Admitting: Bariatrics

## 2020-08-13 ENCOUNTER — Other Ambulatory Visit (INDEPENDENT_AMBULATORY_CARE_PROVIDER_SITE_OTHER): Payer: Self-pay | Admitting: Bariatrics

## 2020-08-13 DIAGNOSIS — E559 Vitamin D deficiency, unspecified: Secondary | ICD-10-CM

## 2020-08-17 MED FILL — VYVANSE 60 MG CAPSULE: 60 | 90 days supply | Qty: 90 | Fill #0

## 2020-08-18 ENCOUNTER — Other Ambulatory Visit (INDEPENDENT_AMBULATORY_CARE_PROVIDER_SITE_OTHER): Payer: Self-pay | Admitting: Bariatrics

## 2020-08-18 DIAGNOSIS — R7303 Prediabetes: Secondary | ICD-10-CM

## 2020-10-29 ENCOUNTER — Telehealth (HOSPITAL_COMMUNITY): Payer: Self-pay | Admitting: Pharmacist

## 2020-10-29 ENCOUNTER — Other Ambulatory Visit (HOSPITAL_COMMUNITY): Payer: Self-pay | Admitting: Family

## 2020-10-29 DIAGNOSIS — U071 COVID-19: Secondary | ICD-10-CM

## 2020-10-29 MED ORDER — NIRMATRELVIR/RITONAVIR (PAXLOVID)TABLET: 3.0000 | ORAL_TABLET | Freq: Two times a day (BID) | ORAL | 0 refills | Status: DC

## 2020-10-29 MED ORDER — MOLNUPIRAVIR EUA 200MG CAPSULE: 4.0000 | ORAL_CAPSULE | Freq: Two times a day (BID) | ORAL | 0 refills | Status: AC

## 2020-10-29 MED FILL — MOLNUPIRAVIR 200 MG CAPS: 200 | 5 days supply | Qty: 40 | Fill #0

## 2020-10-29 NOTE — Progress Notes (Addendum)
Outpatient Oral COVID Treatment Note  I connected with Alexander Solomon on 10/29/2020/3:55 PM by telephone and verified that I am speaking with the correct person using two identifiers.  I discussed the limitations, risks, security, and privacy concerns of performing an evaluation and management service by telephone and the availability of in person appointments. I also discussed with the patient that there may be a patient responsible charge related to this service. The patient expressed understanding and agreed to proceed.  Patient location: Home Provider location: Work  Diagnosis: COVID-19 infection  Purpose of visit: Discussion of potential use of Molnupiravir or Paxlovid, a new treatment for mild to moderate COVID-19 viral infection in non-hospitalized patients.   Subjective: Patient is a 35 y.o. male who has been diagnosed with COVID 19 viral infection.  Their symptoms began on 10/29/2020 with aches, SOB, and cough.    Past Medical History:  Diagnosis Date  . Acute pain of right knee 11/05/2016  . ADD (attention deficit disorder)   . Allergy   . Anxiety   . Attention deficit disorder 09/07/2015  . Back pain   . Benign essential HTN 05/04/2015  . Chest pain 05/04/2015  . Excessive daytime sleepiness 05/04/2015  . GERD (gastroesophageal reflux disease)   . High cholesterol   . High risk sexual behavior 09/07/2015  . Hyperlipidemia 05/04/2015  . Hypertension   . Joint pain   . Morbid obesity (HCC) 09/07/2015  . Obesity   . Obstructive sleep apnea 05/04/2015  . Prediabetes   . PVC (premature ventricular contraction) 05/04/2015  . Seasonal allergies 09/07/2015  . SOB (shortness of breath)     No Known Allergies   Current Outpatient Medications:  .  molnupiravir EUA 200 mg CAPS, Take 4 capsules (800 mg total) by mouth 2 (two) times daily for 5 days., Disp: 40 capsule, Rfl: 0 .  cyclobenzaprine (FLEXERIL) 10 MG tablet, Take 10 mg by mouth 3 (three) times daily as needed for muscle  spasms. Reported on 09/07/2015, Disp: , Rfl:  .  diclofenac (VOLTAREN) 50 MG EC tablet, Take 50 mg by mouth 2 (two) times daily as needed., Disp: , Rfl:  .  fluticasone (FLONASE) 50 MCG/ACT nasal spray, Place 2 sprays into both nostrils daily., Disp: , Rfl:  .  lisdexamfetamine (VYVANSE) 20 MG capsule, Take 60 mg by mouth daily., Disp: , Rfl:  .  loratadine (CLARITIN) 10 MG tablet, Take 10 mg by mouth daily., Disp: , Rfl:  .  losartan-hydrochlorothiazide (HYZAAR) 100-25 MG per tablet, Take 1 tablet by mouth daily., Disp: , Rfl:  .  metFORMIN (GLUCOPHAGE) 500 MG tablet, Take 1 tablet (500 mg total) by mouth daily before supper., Disp: 30 tablet, Rfl: 0 .  Multiple Vitamin (MULTIVITAMIN) tablet, Take 1 tablet by mouth daily., Disp: , Rfl:  .  omeprazole (PRILOSEC) 40 MG capsule, Take 40 mg by mouth every evening. , Disp: , Rfl:  .  Semaglutide (RYBELSUS) 3 MG TABS, Take 4 tablets by mouth daily., Disp: , Rfl:  .  simvastatin (ZOCOR) 40 MG tablet, Take 40 mg by mouth daily., Disp: , Rfl:  .  sucralfate (CARAFATE) 1 g tablet, Take 1 tablet by mouth 3 (three) times daily., Disp: , Rfl:  .  Vitamin D, Ergocalciferol, (DRISDOL) 1.25 MG (50000 UNIT) CAPS capsule, Take 1 capsule (50,000 Units total) by mouth every 7 (seven) days., Disp: 4 capsule, Rfl: 0  Objective: Patient appears/sounds to be in no apparent distress.  Breathing is non labored.  Mood and behavior  are normal.  Laboratory Data:  No results found for this or any previous visit (from the past 2160 hour(s)).   Assessment: 35 y.o. male with mild/moderate COVID 19 viral infection diagnosed on 10/28/2020 at high risk for progression to severe COVID 19.  Plan:  This patient is a 35 y.o. male that meets the following criteria for Emergency Use Authorization of: Molnupiravir  1. Age >18 yr 2. SARS-COV-2 positive test 3. Symptom onset < 5 days 4. Mild-to-moderate COVID disease with high risk for severe progression to hospitalization or  death   I have spoken and communicated the following to the patient or parent/caregiver regarding: 1. Molnupiravir is an unapproved drug that is authorized for use under an TEFL teacher.  2. There are no adequate, approved, available products for the treatment of COVID-19 in adults who have mild-to-moderate COVID-19 and are at high risk for progressing to severe COVID-19, including hospitalization or death. 3. Other therapeutics are currently authorized. For additional information on all products authorized for treatment or prevention of COVID-19, please see https://www.graham-miller.com/.  4. There are benefits and risks of taking this treatment as outlined in the "Fact Sheet for Patients and Caregivers."  5. "Fact Sheet for Patients and Caregivers" was reviewed with patient. A hard copy will be provided to patient from pharmacy prior to the patient receiving treatment. 6. Patients should continue to self-isolate and use infection control measures (e.g., wear mask, isolate, social distance, avoid sharing personal items, clean and disinfect "high touch" surfaces, and frequent handwashing) according to CDC guidelines.  7. The patient or parent/caregiver has the option to accept or refuse treatment. 8. [employer] has established a pregnancy surveillance program. 9. Females of childbearing potential should use a reliable method of contraception correctly and consistently, as applicable, for the duration of treatment and for 4 days after the last dose of Molnupiravir. 10. Males of reproductive potential who are sexually active with females of childbearing potential should use a reliable method of contraception correctly and consistently during treatment and for at least 3 months after the last dose. 11. Pregnancy status and risk was assessed. Patient verbalized understanding of  precautions.   After reviewing above information with the patient, the patient agrees to receive molnupiravir.  Follow up instructions:    . Take prescription BID x 5 days as directed . Reach out to pharmacist for counseling on medication if desired . For concerns regarding further COVID symptoms please follow up with your PCP or urgent care . For urgent or life-threatening issues, seek care at your local emergency department  The patient was provided an opportunity to ask questions, and all were answered. The patient agreed with the plan and demonstrated an understanding of the instructions.   Script sent to [facility] and opted to pick up RX.  The patient was advised to call their PCP or seek an in-person evaluation if the symptoms worsen or if the condition fails to improve as anticipated.   I provided 15 minutes of non face-to-face telephone visit time during this encounter, and > 50% was spent counseling as documented under my assessment & plan.  Morton Stall, NP 10/29/2020 /3:55 PM

## 2020-10-29 NOTE — Telephone Encounter (Signed)
Patient was prescribed oral covid treatment molnupiravir and treatment note was reviewed. Medication has been received by [name] and reviewed for appropriateness.  Drug Interactions or Dosage Adjustments Noted: none  Delivery Method: pick up   Patient contacted for counseling in person cautioned about birth control x 4 mo and verbalized understanding.   Delivery or Pick-Up Date: 10/29/20   [name] 10/29/2020, 4:32 PM [facility] Outpatient Pharmacist Phone# [phone]

## 2020-10-31 ENCOUNTER — Telehealth (HOSPITAL_COMMUNITY): Payer: Self-pay | Admitting: Family

## 2020-10-31 ENCOUNTER — Encounter: Payer: Self-pay | Admitting: Family

## 2020-10-31 NOTE — Telephone Encounter (Signed)
Prescription for Molupirivir sent to pharmacy for patient on Saturday. Discussed with him at that time that I would call him Monday morning (today) to check on him to see how he is doing, and he was grateful for this planned follow up. Called Alexander Solomon as planned and he did not answer. VM was left with hotline phone number for him to call us back if needed.

## 2020-10-31 NOTE — Telephone Encounter (Signed)
Error

## 2020-12-12 ENCOUNTER — Telehealth: Payer: Self-pay

## 2020-12-12 NOTE — Telephone Encounter (Signed)
[facility] Patient Product/process development scientist completed.    The patient is insured through [employer] and has a $0.00 copay.  We will continue to follow to see if copay assistance is needed.  Clearance Coots, CPhT Specialty Pharmacy Patient [facility] Phone: [phone] Fax:  [phone]

## 2020-12-13 ENCOUNTER — Other Ambulatory Visit: Payer: Self-pay | Admitting: Family

## 2020-12-13 ENCOUNTER — Other Ambulatory Visit (HOSPITAL_COMMUNITY)
Admission: RE | Admit: 2020-12-13 | Discharge: 2020-12-13 | Disposition: A | Discharge status: Home / Self Care | Payer: No Typology Code available for payment source | Source: Ambulatory Visit | Attending: Family | Admitting: Family

## 2020-12-13 ENCOUNTER — Other Ambulatory Visit: Payer: Self-pay

## 2020-12-13 ENCOUNTER — Encounter: Payer: Self-pay | Admitting: Family

## 2020-12-13 ENCOUNTER — Ambulatory Visit (INDEPENDENT_AMBULATORY_CARE_PROVIDER_SITE_OTHER): Payer: No Typology Code available for payment source | Admitting: Family

## 2020-12-13 VITALS — BP 130/84 | HR 75 | Ht 71.0 in | Wt 341.0 lb

## 2020-12-13 DIAGNOSIS — Z7252 High risk homosexual behavior: Secondary | ICD-10-CM | POA: Diagnosis not present

## 2020-12-13 MED ORDER — EMTRICITABINE-TENOFOVIR AF 200-25 MG PO TABS: 1.0000 | ORAL_TABLET | Freq: Every day | ORAL | 3 refills | Status: DC

## 2020-12-13 MED FILL — DESCOVY 200-25 MG TABS: 200-25 | 30 days supply | Qty: 30 | Fill #0

## 2020-12-13 NOTE — Progress Notes (Signed)
Subjective:    Patient ID: Alexander Solomon, male    DOB: [birth year], 35 y.o.   MRN: [id number]  Chief Complaint  Patient presents with  . PrEP     HPI:  Alexander Solomon is a 35 y.o. male with previous medical history as detailed below presenting today to restart pre-exposure prophylaxis for HIV. Last seen on 05/05/18 with negative HIV and STD testing.   Alexander Solomon has been in a long term relationship since he was last seen and has been off of his Truvada since that time. No longer in that relationship and interested in starting back on PrEP. Last tested for HIV and STD in November through his primary care office which were all negative. Has been sexually active since that time. No currently having any symptoms. Denies fevers, chills, night sweats, headaches, changes in vision, neck pain/stiffness, nausea, diarrhea, vomiting, lesions or rashes.  No Known Allergies    Outpatient Medications Prior to Visit  Medication Sig Dispense Refill  . cyclobenzaprine (FLEXERIL) 10 MG tablet Take 10 mg by mouth 3 (three) times daily as needed for muscle spasms. Reported on 09/07/2015    . diclofenac (VOLTAREN) 50 MG EC tablet Take 50 mg by mouth 2 (two) times daily as needed.    . fluticasone (FLONASE) 50 MCG/ACT nasal spray Place 2 sprays into both nostrils daily.    Marland Kitchen lisdexamfetamine (VYVANSE) 20 MG capsule Take 60 mg by mouth daily.    Marland Kitchen loratadine (CLARITIN) 10 MG tablet Take 10 mg by mouth daily.    Marland Kitchen losartan-hydrochlorothiazide (HYZAAR) 100-25 MG per tablet Take 1 tablet by mouth daily.    Marland Kitchen omeprazole (PRILOSEC) 40 MG capsule Take 40 mg by mouth every evening.     . Semaglutide (RYBELSUS) 3 MG TABS Take 4 tablets by mouth daily.    . simvastatin (ZOCOR) 40 MG tablet Take 40 mg by mouth daily.    . sucralfate (CARAFATE) 1 g tablet Take 1 tablet by mouth 3 (three) times daily.    . metFORMIN (GLUCOPHAGE) 500 MG tablet Take 1 tablet (500 mg total) by mouth daily before supper. (Patient not taking:  Reported on 12/13/2020) 30 tablet 0  . Multiple Vitamin (MULTIVITAMIN) tablet Take 1 tablet by mouth daily. (Patient not taking: Reported on 12/13/2020)    . Vitamin D, Ergocalciferol, (DRISDOL) 1.25 MG (50000 UNIT) CAPS capsule Take 1 capsule (50,000 Units total) by mouth every 7 (seven) days. (Patient not taking: Reported on 12/13/2020) 4 capsule 0   No facility-administered medications prior to visit.     Past Medical History:  Diagnosis Date  . Acute pain of right knee 11/05/2016  . ADD (attention deficit disorder)   . Allergy   . Anxiety   . Attention deficit disorder 09/07/2015  . Back pain   . Benign essential HTN 05/04/2015  . Chest pain 05/04/2015  . Excessive daytime sleepiness 05/04/2015  . GERD (gastroesophageal reflux disease)   . High cholesterol   . High risk sexual behavior 09/07/2015  . Hyperlipidemia 05/04/2015  . Hypertension   . Joint pain   . Morbid obesity (HCC) 09/07/2015  . Obesity   . Obstructive sleep apnea 05/04/2015  . Prediabetes   . PVC (premature ventricular contraction) 05/04/2015  . Seasonal allergies 09/07/2015  . SOB (shortness of breath)      Past Surgical History:  Procedure Laterality Date  . KNEE SURGERY Bilateral   . WRIST FRACTURE SURGERY         Review of  Systems  Constitutional: Negative for appetite change, chills, fatigue, fever and unexpected weight change.  Eyes: Negative for visual disturbance.  Respiratory: Negative for cough, chest tightness, shortness of breath and wheezing.   Cardiovascular: Negative for chest pain and leg swelling.  Gastrointestinal: Negative for abdominal pain, constipation, diarrhea, nausea and vomiting.  Genitourinary: Negative for dysuria, flank pain, frequency, genital sores, hematuria and urgency.  Skin: Negative for rash.  Allergic/Immunologic: Negative for immunocompromised state.  Neurological: Negative for dizziness and headaches.      Objective:    BP 130/84   Pulse 75   Ht 5\' 11"  (1.803  m)   Wt (!) 341 lb (154.7 kg)   BMI 47.56 kg/m  Nursing note and vital signs reviewed.  Physical Exam Constitutional:      General: He is not in acute distress.    Appearance: He is well-developed.  Eyes:     Conjunctiva/sclera: Conjunctivae normal.  Cardiovascular:     Rate and Rhythm: Normal rate and regular rhythm.     Heart sounds: Normal heart sounds. No murmur heard. No friction rub. No gallop.   Pulmonary:     Effort: Pulmonary effort is normal. No respiratory distress.     Breath sounds: Normal breath sounds. No wheezing or rales.  Chest:     Chest wall: No tenderness.  Abdominal:     General: Bowel sounds are normal.     Palpations: Abdomen is soft.     Tenderness: There is no abdominal tenderness.  Musculoskeletal:     Cervical back: Neck supple.  Lymphadenopathy:     Cervical: No cervical adenopathy.  Skin:    General: Skin is warm and dry.     Findings: No rash.  Neurological:     Mental Status: He is alert and oriented to person, place, and time.  Psychiatric:        Behavior: Behavior normal.        Thought Content: Thought content normal.        Judgment: Judgment normal.      Depression screen [facility] 2/9 12/13/2020 07/13/2020 03/08/2016 03/08/2016 12/06/2015  Decreased Interest 0 3 0 0 0  Down, Depressed, Hopeless 0 3 0 0 0  PHQ - 2 Score 0 6 0 0 0  Altered sleeping - 3 - - -  Tired, decreased energy - 3 - - -  Change in appetite - 3 - - -  Feeling bad or failure about yourself  - 3 - - -  Trouble concentrating - 3 - - -  Moving slowly or fidgety/restless - 3 - - -  Suicidal thoughts - 0 - - -  PHQ-9 Score - 24 - - -  Difficult doing work/chores - Extremely dIfficult - - -       Assessment & Plan:    Patient Active Problem List   Diagnosis Date Noted  . Prediabetes 07/14/2020  . Vitamin D insufficiency 07/14/2020  . DOE (dyspnea on exertion) 03/14/2020  . Acute pain of right knee 11/05/2016  . Morbid obesity (HCC) 09/07/2015  . GERD  (gastroesophageal reflux disease) 09/07/2015  . Seasonal allergies 09/07/2015  . Attention deficit disorder 09/07/2015  . Anxiety 09/07/2015  . High risk sexual behavior 09/07/2015  . PVC (premature ventricular contraction) 05/04/2015  . Benign essential HTN 05/04/2015  . Hyperlipidemia 05/04/2015  . Obstructive sleep apnea 05/04/2015     Problem List Items Addressed This Visit      Other   High risk sexual behavior - Primary  Alexander Solomon is a 35 y/o male previously on Truvada for PrEP returning to re-establish care as he has been off medication for the past 2 years. Most recent HIV and STD testing negative in November. Discussed the risks, benefits and limitations of PrEP. Check HIV and STD lab work. Plan to start Descovy pending lab work results. Discussed importance of safe sexual practice to reduce risk of STI. Condoms declined. Plan for follow up in 3 months or sooner if needed.       Relevant Orders   Cytology (oral, anal, urethral) ancillary only   Cytology (oral, anal, urethral) ancillary only   RPR   Urine cytology ancillary only([facility])   HIV antibody (with reflex)       I am having Alexander Solomon start on emtricitabine-tenofovir AF. I am also having Alexander Solomon maintain his multivitamin, losartan-hydrochlorothiazide, simvastatin, cyclobenzaprine, omeprazole, lisdexamfetamine, fluticasone, loratadine, sucralfate, diclofenac, Rybelsus, Vitamin D (Ergocalciferol), and metFORMIN.   Meds ordered this encounter  Medications  . emtricitabine-tenofovir AF (DESCOVY) 200-25 MG tablet    Sig: Take 1 tablet by mouth daily.    Dispense:  30 tablet    Refill:  3    Order Specific Question:   Supervising Provider    Answer:   Judyann Munson [[id number]]     Follow-up: Return in about 3 months (around 03/15/2021), or if symptoms worsen or fail to improve.   Marcos Eke, MSN, FNP-C Nurse Practitioner [facility] for [facility]ious [facility] Main number:  [phone]

## 2020-12-13 NOTE — Assessment & Plan Note (Signed)
Alexander Solomon is a 35 y/o male previously on Truvada for PrEP returning to re-establish care as he has been off medication for the past 2 years. Most recent HIV and STD testing negative in November. Discussed the risks, benefits and limitations of PrEP. Check HIV and STD lab work. Plan to start Descovy pending lab work results. Discussed importance of safe sexual practice to reduce risk of STI. Condoms declined. Plan for follow up in 3 months or sooner if needed.

## 2020-12-13 NOTE — Patient Instructions (Signed)
Nice to see you.  We will check your lab work today.  We will get you started on Descovy - 1 tablet by mouth daily.  Refills may require a mail order pharmacy.   Plan for follow up in 3 months or sooner if needed.   Have a great day and stay safe!

## 2020-12-14 LAB — CYTOLOGY, (ORAL, ANAL, URETHRAL) ANCILLARY ONLY
Chlamydia: NEGATIVE
Chlamydia: NEGATIVE
Comment: NEGATIVE
Comment: NEGATIVE
Comment: NORMAL
Comment: NORMAL
Neisseria Gonorrhea: NEGATIVE
Neisseria Gonorrhea: NEGATIVE

## 2020-12-14 LAB — URINE CYTOLOGY ANCILLARY ONLY
Chlamydia: NEGATIVE
Comment: NEGATIVE
Comment: NORMAL
Neisseria Gonorrhea: NEGATIVE

## 2020-12-14 LAB — RPR: RPR Ser Ql: NONREACTIVE

## 2020-12-14 LAB — HIV ANTIBODY (ROUTINE TESTING W REFLEX): HIV 1&2 Ab, 4th Generation: NONREACTIVE

## 2020-12-20 ENCOUNTER — Other Ambulatory Visit (HOSPITAL_COMMUNITY): Payer: Self-pay

## 2021-01-03 ENCOUNTER — Other Ambulatory Visit (HOSPITAL_COMMUNITY): Payer: Self-pay

## 2021-01-05 ENCOUNTER — Other Ambulatory Visit (HOSPITAL_COMMUNITY): Payer: Self-pay

## 2021-01-05 ENCOUNTER — Other Ambulatory Visit: Payer: Self-pay | Admitting: Family

## 2021-01-05 ENCOUNTER — Other Ambulatory Visit: Payer: Self-pay

## 2021-01-05 MED ORDER — EMTRICITABINE-TENOFOVIR AF 200-25 MG PO TABS: 1.0000 | ORAL_TABLET | Freq: Every day | ORAL | 3 refills | Status: DC

## 2021-01-05 MED FILL — Emtricitabine-Tenofovir Alafenamide Fumarate Tab 200-25 MG: ORAL | 30 days supply | Qty: 30 | Fill #0 | Status: CN

## 2021-01-09 NOTE — Telephone Encounter (Signed)
Received call from [facility] requesting ICD 10 code for Descovy. RN provided diagnosis code Z72.52.  Sandie Ano, RN

## 2021-01-09 NOTE — Telephone Encounter (Signed)
RN spoke with pharmacist, Raheem, at [facility] and provided missing ICD 10 code. Raheem states medication should be approved now. RN sent MyChart message to patient letting him know the issue should be resolved.   Sandie Ano, RN

## 2021-02-27 ENCOUNTER — Ambulatory Visit (INDEPENDENT_AMBULATORY_CARE_PROVIDER_SITE_OTHER): Payer: No Typology Code available for payment source | Admitting: Orthopedic Surgery

## 2021-02-27 ENCOUNTER — Telehealth: Payer: Self-pay | Admitting: Orthopedic Surgery

## 2021-02-27 ENCOUNTER — Ambulatory Visit (INDEPENDENT_AMBULATORY_CARE_PROVIDER_SITE_OTHER): Payer: No Typology Code available for payment source

## 2021-02-27 ENCOUNTER — Other Ambulatory Visit: Payer: Self-pay | Admitting: Surgical

## 2021-02-27 DIAGNOSIS — M25571 Pain in right ankle and joints of right foot: Secondary | ICD-10-CM | POA: Diagnosis not present

## 2021-02-27 DIAGNOSIS — S8261XD Displaced fracture of lateral malleolus of right fibula, subsequent encounter for closed fracture with routine healing: Secondary | ICD-10-CM | POA: Diagnosis not present

## 2021-02-27 MED ORDER — ACETAMINOPHEN-CODEINE #3 300-30 MG PO TABS: 1.0000 | ORAL_TABLET | Freq: Four times a day (QID) | ORAL | 0 refills | Status: DC | PRN

## 2021-02-27 NOTE — Telephone Encounter (Signed)
Pt mother has called and she thinks he son may have broke his foot. She said it snapped twice and she knows August Saucer is in surgery today but she would like him to be worked in asap if you can. Please call her back [phone]

## 2021-02-27 NOTE — Telephone Encounter (Signed)
Worked patient in.

## 2021-03-02 ENCOUNTER — Encounter: Payer: Self-pay | Admitting: Orthopedic Surgery

## 2021-03-02 NOTE — Progress Notes (Signed)
Office Visit Note   Patient: Alexander Solomon           Date of Birth: 09/17/1986           MRN: [id number] Visit Date: 02/27/2021 Requested by: Lupita Raider, MD [address] [place],  [place] [address] PCP: Lupita Raider, MD  Subjective: Chief Complaint  Patient presents with   Right Ankle - Pain   Right Foot - Pain    HPI: Patient presents for evaluation of right foot pain.  Patient sustained right foot eversion injury 02/25/2021.  He was playing badminton.  Rolled his foot.  Heard a pop and felt something come back in the place.  Describes swelling and bruising.  He has tried to be nonweightbearing.  Takes ibuprofen as needed.  No personal or family history of DVT or pulmonary embolism.              ROS: All systems reviewed are negative as they relate to the chief complaint within the history of present illness.  Patient denies  fevers or chills.   Assessment & Plan: Visit Diagnoses:  1. Pain in right ankle and joints of right foot     Plan: Impression is right nondisplaced lateral malleolus fracture with no medial sided injury and symmetric mortise.  Plan is Tylenol 3 for pain with aspirin 81 mg twice a day for DVT prophylaxis.  Fracture boot and crutches.  Out of work 7 days due to this ankle fracture.  7-day return with repeat radiographs.  Inherently this is a stable fracture.  Need to check radiographs in 7 days to make sure displacement is not occurring.  Follow-Up Instructions: Return in about 1 week (around 03/06/2021).   Orders:  Orders Placed This Encounter  Procedures   XR Foot Complete Right   XR Ankle Complete Right   Meds ordered this encounter  Medications   acetaminophen-codeine (TYLENOL #3) 300-30 MG tablet    Sig: Take 1 tablet by mouth every 6 (six) hours as needed for moderate pain.    Dispense:  30 tablet    Refill:  0      Procedures: No procedures performed   Clinical Data: No additional findings.  Objective: Vital Signs:  There were no vitals taken for this visit.  Physical Exam:   Constitutional: Patient appears well-developed HEENT:  Head: Normocephalic Eyes:EOM are normal Neck: Normal range of motion Cardiovascular: Normal rate Pulmonary/chest: Effort normal Neurologic: Patient is alert Skin: Skin is warm Psychiatric: Patient has normal mood and affect   Ortho Exam: Ortho exam demonstrates ecchymosis and swelling around the lateral malleolus on the right-hand side.  No tenderness at the base of the first or fifth metatarsal.  Patient has palpable intact nontender anterior to posterior to peroneal and Achilles tendons.  No medial sided tenderness is present.  Compartments are soft.  Specialty Comments:  No specialty comments available.  Imaging: No results found.   PMFS History: Patient Active Problem List   Diagnosis Date Noted   Prediabetes 07/14/2020   Vitamin D insufficiency 07/14/2020   DOE (dyspnea on exertion) 03/14/2020   Acute pain of right knee 11/05/2016   Morbid obesity (HCC) 09/07/2015   GERD (gastroesophageal reflux disease) 09/07/2015   Seasonal allergies 09/07/2015   Attention deficit disorder 09/07/2015   Anxiety 09/07/2015   High risk sexual behavior 09/07/2015   PVC (premature ventricular contraction) 05/04/2015   Benign essential HTN 05/04/2015   Hyperlipidemia 05/04/2015   Obstructive sleep apnea 05/04/2015  Past Medical History:  Diagnosis Date   Acute pain of right knee 11/05/2016   ADD (attention deficit disorder)    Allergy    Anxiety    Attention deficit disorder 09/07/2015   Back pain    Benign essential HTN 05/04/2015   Chest pain 05/04/2015   Excessive daytime sleepiness 05/04/2015   GERD (gastroesophageal reflux disease)    High cholesterol    High risk sexual behavior 09/07/2015   Hyperlipidemia 05/04/2015   Hypertension    Joint pain    Morbid obesity (HCC) 09/07/2015   Obesity    Obstructive sleep apnea 05/04/2015   Prediabetes    PVC  (premature ventricular contraction) 05/04/2015   Seasonal allergies 09/07/2015   SOB (shortness of breath)     Family History  Problem Relation Age of Onset   Hypertension Mother    Diabetes Mother    High Cholesterol Mother    Anxiety disorder Mother    Sleep apnea Mother    Obesity Mother    Heart disease Father    Heart attack Father    High blood pressure Father    High Cholesterol Father     Past Surgical History:  Procedure Laterality Date   KNEE SURGERY Bilateral    WRIST FRACTURE SURGERY     Social History   Occupational History   Occupation: Biomedical scientist  Tobacco Use   Smoking status: Never   Smokeless tobacco: Never  Substance and Sexual Activity   Alcohol use: No    Alcohol/week: 0.0 standard drinks   Drug use: No   Sexual activity: Not Currently

## 2021-03-06 ENCOUNTER — Ambulatory Visit: Payer: Self-pay

## 2021-03-06 ENCOUNTER — Other Ambulatory Visit: Payer: Self-pay

## 2021-03-06 ENCOUNTER — Ambulatory Visit (INDEPENDENT_AMBULATORY_CARE_PROVIDER_SITE_OTHER): Payer: No Typology Code available for payment source | Admitting: Orthopedic Surgery

## 2021-03-06 DIAGNOSIS — S8261XD Displaced fracture of lateral malleolus of right fibula, subsequent encounter for closed fracture with routine healing: Secondary | ICD-10-CM

## 2021-03-09 ENCOUNTER — Encounter: Payer: Self-pay | Admitting: Orthopedic Surgery

## 2021-03-09 NOTE — Progress Notes (Signed)
Post-Op Visit Note   Patient: Alexander Solomon           Date of Birth: 05/08/1986           MRN: [id number] Visit Date: 03/06/2021 PCP: Lupita Raider, MD   Assessment & Plan:  Chief Complaint:  Chief Complaint  Patient presents with   Right Ankle - Fracture, Follow-up   Visit Diagnoses:  1. Displaced fracture of lateral malleolus of right fibula, subsequent encounter for closed fracture with routine healing     Plan: Patient is a 35 year old male who returns for recheck on right ankle fracture.  Date of injury was 02/25/2021.  He is remaining nonweightbearing that he did fall this morning.  Wearing a fracture boot.  Pain is well controlled and he is elevating his foot.  Only time he has significant pain is if he lets his right leg hang down.  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   On exam he has continued swelling and bruising over the lateral aspect of the right ankle with tenderness over the lateral malleolus in particular.  Syndesmosis is stable on syndesmotic stress testing.  No calf tenderness.  Negative Homans' sign.  Intact dorsiflexion, plantarflexion, inversion, eversion.  2+ DP pulse.  Radiographs taken today show ankle fracture in similar position to prior radiographs from last week.  Ankle mortise is well-maintained.  No displacement since previous radiographs.  Plan to continue nonweightbearing.  Work note provided.  Follow-up in 1 week for clinical recheck with new radiographs to ensure no displacement.  He does have history of low vitamin D so encouraged him to continue with his vitamin D supplement at about 2000-5000 units/day.  Follow-Up Instructions: No follow-ups on file.   Orders:  Orders Placed This Encounter  Procedures   XR Ankle Complete Right   No orders of the defined types were placed in this encounter.   Imaging: No results found.  PMFS History: Patient Active Problem List   Diagnosis Date Noted   Prediabetes  07/14/2020   Vitamin D insufficiency 07/14/2020   DOE (dyspnea on exertion) 03/14/2020   Acute pain of right knee 11/05/2016   Morbid obesity (HCC) 09/07/2015   GERD (gastroesophageal reflux disease) 09/07/2015   Seasonal allergies 09/07/2015   Attention deficit disorder 09/07/2015   Anxiety 09/07/2015   High risk sexual behavior 09/07/2015   PVC (premature ventricular contraction) 05/04/2015   Benign essential HTN 05/04/2015   Hyperlipidemia 05/04/2015   Obstructive sleep apnea 05/04/2015   Past Medical History:  Diagnosis Date   Acute pain of right knee 11/05/2016   ADD (attention deficit disorder)    Allergy    Anxiety    Attention deficit disorder 09/07/2015   Back pain    Benign essential HTN 05/04/2015   Chest pain 05/04/2015   Excessive daytime sleepiness 05/04/2015   GERD (gastroesophageal reflux disease)    High cholesterol    High risk sexual behavior 09/07/2015   Hyperlipidemia 05/04/2015   Hypertension    Joint pain    Morbid obesity (HCC) 09/07/2015   Obesity    Obstructive sleep apnea 05/04/2015   Prediabetes    PVC (premature ventricular contraction) 05/04/2015   Seasonal allergies 09/07/2015   SOB (shortness of breath)     Family History  Problem Relation Age of Onset   Hypertension Mother    Diabetes Mother    High Cholesterol Mother    Anxiety disorder Mother    Sleep apnea Mother  Obesity Mother    Heart disease Father    Heart attack Father    High blood pressure Father    High Cholesterol Father     Past Surgical History:  Procedure Laterality Date   KNEE SURGERY Bilateral    WRIST FRACTURE SURGERY     Social History   Occupational History   Occupation: Biomedical scientist  Tobacco Use   Smoking status: Never   Smokeless tobacco: Never  Substance and Sexual Activity   Alcohol use: No    Alcohol/week: 0.0 standard drinks   Drug use: No   Sexual activity: Not Currently

## 2021-03-10 ENCOUNTER — Telehealth: Payer: Self-pay

## 2021-03-10 ENCOUNTER — Other Ambulatory Visit: Payer: Self-pay

## 2021-03-10 ENCOUNTER — Other Ambulatory Visit: Payer: Self-pay | Admitting: Surgical

## 2021-03-10 NOTE — Telephone Encounter (Signed)
Patient called he is requesting a rx refill for acetaminophen-codeine  [facility] [place] call back:[phone]   [id number] - [place], [place] - [address]

## 2021-03-10 NOTE — Telephone Encounter (Signed)
Please advise 

## 2021-03-15 ENCOUNTER — Ambulatory Visit: Payer: No Typology Code available for payment source | Admitting: Pharmacist

## 2021-03-15 ENCOUNTER — Other Ambulatory Visit: Payer: Self-pay

## 2021-03-15 ENCOUNTER — Ambulatory Visit (INDEPENDENT_AMBULATORY_CARE_PROVIDER_SITE_OTHER): Payer: No Typology Code available for payment source | Admitting: Orthopedic Surgery

## 2021-03-15 ENCOUNTER — Ambulatory Visit: Payer: Self-pay

## 2021-03-15 DIAGNOSIS — S8261XD Displaced fracture of lateral malleolus of right fibula, subsequent encounter for closed fracture with routine healing: Secondary | ICD-10-CM | POA: Diagnosis not present

## 2021-03-18 ENCOUNTER — Encounter: Payer: Self-pay | Admitting: Orthopedic Surgery

## 2021-03-18 NOTE — Progress Notes (Signed)
Post-Op Visit Note   Patient: Alexander Solomon           Date of Birth: 05-07-1986           MRN: [id number] Visit Date: 03/15/2021 PCP: Lupita Raider, MD   Assessment & Plan:  Chief Complaint:  Chief Complaint  Patient presents with   Right Ankle - Follow-up, Fracture    DOI 02/25/21   Visit Diagnoses:  1. Displaced fracture of lateral malleolus of right fibula, subsequent encounter for closed fracture with routine healing     Plan: Patient is a 35 year old male who returns for reevaluation of right ankle fracture.  Date of injury was 02/25/2021.  He has been nonweightbearing and wearing a fracture boot.  Pain is improving and is not as severe when he lets his leg hang down in a dependent position.  Denies any chest pain, shortness of breath, calf pain.  Swelling improving and bruising improving as well.  Does have a history of low vitamin D in the past but he takes supplements now every day.  On exam right ankle has continued ecchymosis that is improved.  Improved swelling since prior exam.  2+ DP pulse.  Syndesmosis stable.  Tenderness over the lateral malleolus that patient rates about 4/10.  No calf tenderness.  Negative Homans' sign.  Today's radiographs show ankle fracture in similar position as prior studies with no further displacement.  No callus formation just yet.  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  Follow-up in 2 weeks for clinical recheck with new radiographs.  Recommended he continue with his vitamin D supplement and anticipate that the weightbearing force will help stimulate some callus formation.  Follow-Up Instructions: No follow-ups on file.   Orders:  Orders Placed This Encounter  Procedures   XR Ankle Complete Right   No orders of the defined types were placed in this encounter.   Imaging: No results found.  PMFS History: Patient Active Problem List   Diagnosis Date Noted    Prediabetes 07/14/2020   Vitamin D insufficiency 07/14/2020   DOE (dyspnea on exertion) 03/14/2020   Acute pain of right knee 11/05/2016   Morbid obesity (HCC) 09/07/2015   GERD (gastroesophageal reflux disease) 09/07/2015   Seasonal allergies 09/07/2015   Attention deficit disorder 09/07/2015   Anxiety 09/07/2015   High risk sexual behavior 09/07/2015   PVC (premature ventricular contraction) 05/04/2015   Benign essential HTN 05/04/2015   Hyperlipidemia 05/04/2015   Obstructive sleep apnea 05/04/2015   Past Medical History:  Diagnosis Date   Acute pain of right knee 11/05/2016   ADD (attention deficit disorder)    Allergy    Anxiety    Attention deficit disorder 09/07/2015   Back pain    Benign essential HTN 05/04/2015   Chest pain 05/04/2015   Excessive daytime sleepiness 05/04/2015   GERD (gastroesophageal reflux disease)    High cholesterol    High risk sexual behavior 09/07/2015   Hyperlipidemia 05/04/2015   Hypertension    Joint pain    Morbid obesity (HCC) 09/07/2015   Obesity    Obstructive sleep apnea 05/04/2015   Prediabetes    PVC (premature ventricular contraction) 05/04/2015   Seasonal allergies 09/07/2015   SOB (shortness of breath)     Family History  Problem Relation Age of Onset   Hypertension Mother    Diabetes Mother    High Cholesterol Mother    Anxiety disorder  Mother    Sleep apnea Mother    Obesity Mother    Heart disease Father    Heart attack Father    High blood pressure Father    High Cholesterol Father     Past Surgical History:  Procedure Laterality Date   KNEE SURGERY Bilateral    WRIST FRACTURE SURGERY     Social History   Occupational History   Occupation: Biomedical scientist  Tobacco Use   Smoking status: Never   Smokeless tobacco: Never  Substance and Sexual Activity   Alcohol use: No    Alcohol/week: 0.0 standard drinks   Drug use: No   Sexual activity: Not Currently

## 2021-03-19 ENCOUNTER — Encounter: Payer: Self-pay | Admitting: Orthopedic Surgery

## 2021-03-22 ENCOUNTER — Ambulatory Visit (INDEPENDENT_AMBULATORY_CARE_PROVIDER_SITE_OTHER): Payer: No Typology Code available for payment source | Admitting: Pharmacist

## 2021-03-22 ENCOUNTER — Other Ambulatory Visit (HOSPITAL_COMMUNITY)
Admission: RE | Admit: 2021-03-22 | Discharge: 2021-03-22 | Disposition: A | Discharge status: Home / Self Care | Payer: No Typology Code available for payment source | Source: Ambulatory Visit | Attending: Infectious Disease | Admitting: Infectious Disease

## 2021-03-22 ENCOUNTER — Other Ambulatory Visit: Payer: Self-pay

## 2021-03-22 DIAGNOSIS — Z113 Encounter for screening for infections with a predominantly sexual mode of transmission: Secondary | ICD-10-CM

## 2021-03-22 DIAGNOSIS — Z79899 Other long term (current) drug therapy: Secondary | ICD-10-CM

## 2021-03-22 NOTE — Progress Notes (Signed)
Date:  03/22/2021   HPI: Alexander Solomon is a 35 y.o. male who presents to the [facility] clinic for HIV PrEP follow-up.  Insured   [x]    Uninsured  []    Patient Active Problem List   Diagnosis Date Noted   Prediabetes 07/14/2020   Vitamin D insufficiency 07/14/2020   DOE (dyspnea on exertion) 03/14/2020   Acute pain of right knee 11/05/2016   Morbid obesity (HCC) 09/07/2015   GERD (gastroesophageal reflux disease) 09/07/2015   Seasonal allergies 09/07/2015   Attention deficit disorder 09/07/2015   Anxiety 09/07/2015   High risk sexual behavior 09/07/2015   PVC (premature ventricular contraction) 05/04/2015   Benign essential HTN 05/04/2015   Hyperlipidemia 05/04/2015   Obstructive sleep apnea 05/04/2015    Patient's Medications  New Prescriptions   No medications on file  Previous Medications   ACETAMINOPHEN-CODEINE 300-30 MG TABLET    Take 1 tablet by mouth every 12 (twelve) hours as needed. Okay to take NSAIDs with this medication   CYCLOBENZAPRINE (FLEXERIL) 10 MG TABLET    Take 10 mg by mouth 3 (three) times daily as needed for muscle spasms. Reported on 09/07/2015   DESCOVY 200-25 MG TABLET    TAKE 1 TABLET BY MOUTH DAILY.   DICLOFENAC (VOLTAREN) 50 MG EC TABLET    Take 50 mg by mouth 2 (two) times daily as needed.   FLUTICASONE (FLONASE) 50 MCG/ACT NASAL SPRAY    Place 2 sprays into both nostrils daily.   LISDEXAMFETAMINE (VYVANSE) 20 MG CAPSULE    Take 60 mg by mouth daily.   LORATADINE (CLARITIN) 10 MG TABLET    Take 10 mg by mouth daily.   LOSARTAN-HYDROCHLOROTHIAZIDE (HYZAAR) 100-25 MG PER TABLET    Take 1 tablet by mouth daily.   METFORMIN (GLUCOPHAGE) 500 MG TABLET    Take 1 tablet (500 mg total) by mouth daily before supper.   MOLNUPIRAVIR 200 MG CAPS    TAKE 4 CAPSULES (800 MG TOTAL) BY MOUTH 2 (TWO) TIMES DAILY FOR 5 DAYS.   MULTIPLE VITAMIN (MULTIVITAMIN) TABLET    Take 1 tablet by mouth daily.   OMEPRAZOLE (PRILOSEC) 40 MG CAPSULE    Take 40 mg by mouth  every evening.    SEMAGLUTIDE (RYBELSUS) 3 MG TABS    Take 4 tablets by mouth daily.   SIMVASTATIN (ZOCOR) 40 MG TABLET    Take 40 mg by mouth daily.   SUCRALFATE (CARAFATE) 1 G TABLET    Take 1 tablet by mouth 3 (three) times daily.   VITAMIN D, ERGOCALCIFEROL, (DRISDOL) 1.25 MG (50000 UNIT) CAPS CAPSULE    Take 1 capsule (50,000 Units total) by mouth every 7 (seven) days.  Modified Medications   No medications on file  Discontinued Medications   No medications on file    Allergies: No Known Allergies  Past Medical History: Past Medical History:  Diagnosis Date   Acute pain of right knee 11/05/2016   ADD (attention deficit disorder)    Allergy    Anxiety    Attention deficit disorder 09/07/2015   Back pain    Benign essential HTN 05/04/2015   Chest pain 05/04/2015   Excessive daytime sleepiness 05/04/2015   GERD (gastroesophageal reflux disease)    High cholesterol    High risk sexual behavior 09/07/2015   Hyperlipidemia 05/04/2015   Hypertension    Joint pain    Morbid obesity (HCC) 09/07/2015   Obesity    Obstructive sleep apnea 05/04/2015   Prediabetes  PVC (premature ventricular contraction) 05/04/2015   Seasonal allergies 09/07/2015   SOB (shortness of breath)     Social History: Social History   Socioeconomic History   Marital status: Divorced    Spouse name: katelin   Number of children: 1   Years of education: college   Highest education level: Not on file  Occupational History   Occupation: Biomedical scientist  Tobacco Use   Smoking status: Never   Smokeless tobacco: Never  Substance and Sexual Activity   Alcohol use: No    Alcohol/week: 0.0 standard drinks   Drug use: No   Sexual activity: Not Currently  Other Topics Concern   Not on file  Social History Narrative   Not on file   Social Determinants of Health   Financial Resource Strain: Not on file  Food Insecurity: Not on file  Transportation Needs: Not on file  Physical Activity: Not  on file  Stress: Not on file  Social Connections: Not on file    [facility] HIV PREP FLOWSHEET RESULTS 05/05/2018 02/14/2018 01/17/2018  Insurance Status Insured Insured Insured  How did you hear? - Family Physician Referred from Dr Clelia Croft  Gender at birth Male Male Male  Gender identity cis-Male cis-Male cis-Male  Risk for HIV >5 partners in past 6 mos (regardless of condom use);Condomless vaginal or anal intercourse Condomless vaginal or anal intercourse;>5 partners in past 6 mos (regardless of condom use);Hx of STI Condomless vaginal or anal intercourse  Sex Partners Men only Men only Men only  # sex partners past 3-6 mos 4-6 4-6 1-3  Sex activity preferences Insertive and receptive Insertive and receptive;Oral Insertive and receptive;Oral  Condom use Yes Yes No  % condom use 31 95 -  Partners genders and ages - 52 20-24;M 25-29;M 70-49 M 20-24;M 25-29;M 30-49  Treated for STI? No Yes N/A  HIV symptoms? N/A N/A N/A  PrEP Eligibility CrCl >60 ml/min HIV negative;CrCl >60 ml/min;Substantial risk for HIV Substantial risk for HIV;HIV negative  Paper work received? - No No    Labs:  SCr: Lab Results  Component Value Date   CREATININE 0.73 (L) 07/13/2020   CREATININE 0.82 02/14/2018   CREATININE 0.82 01/17/2018   CREATININE 0.83 10/30/2016   CREATININE 0.80 03/08/2016   HIV Lab Results  Component Value Date   HIV NON-REACTIVE 12/13/2020   HIV NON-REACTIVE 05/05/2018   HIV NON-REACTIVE 02/14/2018   HIV NON-REACTIVE 01/17/2018   HIV NONREACTIVE 10/30/2016   Hepatitis B Lab Results  Component Value Date   HEPBSAB NON-REACTIVE 01/17/2018   HEPBSAG NEGATIVE 09/07/2015   Hepatitis C No results found for: HEPCAB, HCVRNAPCRQN Hepatitis A Lab Results  Component Value Date   HAV REACTIVE (A) 01/17/2018   RPR and STI Lab Results  Component Value Date   LABRPR NON-REACTIVE 12/13/2020   LABRPR NON-REACTIVE 05/05/2018   LABRPR NON-REACTIVE 01/17/2018   LABRPR NON REAC 10/30/2016    LABRPR NON REAC 03/08/2016    STI Results GC CT  12/13/2020 Negative Negative  12/13/2020 Negative Negative  12/13/2020 Negative Negative  05/05/2018 Negative Negative  05/05/2018 Negative Negative  05/05/2018 Negative Negative  02/14/2018 Negative Negative  01/17/2018 Negative Negative  01/17/2018 **POSITIVE**(A) Negative  01/17/2018 Negative Negative  10/30/2016 Negative Negative  10/30/2016 Negative Negative  10/30/2016 Negative Negative  03/08/2016 Negative Negative  12/06/2015 Negative Negative    Assessment: Savir is here today for his 3 month follow up for HIV PrEP and lab work. He restarted Descovy back in March after being off  of it for a few years. He was in a monogamous, long term relationship that has since ended. No issues or side effects from restarting Descovy. He takes it daily in the morning. No problem with his insurance paying for it or obtaining it from the pharmacy. He has about 2 weeks left of his current bottle.   He broke his ankle back at the beginning of June playing badminton and is seeing Dr. August Saucer at [facility] for management. He has not been sexually active in th last 1.5-2 months. He has been masturbating more because of this and has noticed a bump on the shaft of his penis. It is mildly painful with no odor or oozing. No open lesions or bumps/pain anywhere else. He used prescription steroid cream and it seemed to help. Dr. Earlene Plater examed his bump and it seems to be general irritation from masturbating. Advised to use lotion or lubricant. He will call us if the sore becomes active or returns so we can possibly get a swab/sample for identification.   Will do HIV and STI screening today. Screened for acute HIV symptoms such as fatigue, muscle aches, rash, sore throat, lymphadenopathy, headache, night sweats, nausea/vomiting/diarrhea, and fever. Denies any symptoms. His kidney function was checked in October and was normal. Per new 2021 CDC HIV PrEP guidelines, he only  needs q12 month monitoring for PrEP (his PCP checks q6 month since he takes several long term medications). Will send in 3 more months of Descovy if HIV negative and have him follow up in 3 months or sooner if needed.  Plan: - HIV antibody, urine cytology, RPR today - Descovy x 3 months if HIV negative - F/u in 3 months with me  Ladaysha Soutar L. Taccara Bushnell, PharmD, BCIDP, AAHIVP, CPP Clinical Pharmacist Practitioner Infectious Diseases Clinical Pharmacist [facility] 03/22/2021, 11:31 AM

## 2021-03-23 ENCOUNTER — Other Ambulatory Visit: Payer: Self-pay | Admitting: Pharmacist

## 2021-03-23 ENCOUNTER — Encounter: Payer: Self-pay | Admitting: Pharmacist

## 2021-03-23 DIAGNOSIS — Z79899 Other long term (current) drug therapy: Secondary | ICD-10-CM

## 2021-03-23 LAB — URINE CYTOLOGY ANCILLARY ONLY
Chlamydia: NEGATIVE
Comment: NEGATIVE
Comment: NORMAL
Neisseria Gonorrhea: NEGATIVE

## 2021-03-23 LAB — RPR: RPR Ser Ql: NONREACTIVE

## 2021-03-23 LAB — HIV ANTIBODY (ROUTINE TESTING W REFLEX): HIV 1&2 Ab, 4th Generation: NONREACTIVE

## 2021-03-23 MED ORDER — DESCOVY 200-25 MG PO TABS: 1.0000 | ORAL_TABLET | Freq: Every day | ORAL | 2 refills | Status: DC

## 2021-03-29 ENCOUNTER — Ambulatory Visit (INDEPENDENT_AMBULATORY_CARE_PROVIDER_SITE_OTHER): Payer: No Typology Code available for payment source | Admitting: Orthopedic Surgery

## 2021-03-29 ENCOUNTER — Ambulatory Visit (INDEPENDENT_AMBULATORY_CARE_PROVIDER_SITE_OTHER): Payer: No Typology Code available for payment source

## 2021-03-29 DIAGNOSIS — S8261XD Displaced fracture of lateral malleolus of right fibula, subsequent encounter for closed fracture with routine healing: Secondary | ICD-10-CM

## 2021-04-02 ENCOUNTER — Encounter: Payer: Self-pay | Admitting: Orthopedic Surgery

## 2021-04-02 NOTE — Progress Notes (Signed)
Post-Op Visit Note   Patient: Alexander Solomon           Date of Birth: 09/23/1986           MRN: [id number] Visit Date: 03/29/2021 PCP: Lupita Raider, MD   Assessment & Plan:  Chief Complaint:  Chief Complaint  Patient presents with   Right Ankle - Fracture, Follow-up   Visit Diagnoses:  1. Displaced fracture of lateral malleolus of right fibula, subsequent encounter for closed fracture with routine healing     Plan: Patient is a 35 year old male who returns for evaluation following right ankle fracture sustained on 02/25/2021.  He is doing well and currently partial weightbearing with crutches in a fracture boot.  Reports that pain is steadily improving.  No recent fall since last office visit.  Feels he is able to do partial weightbearing without significant pain.  Radiographs of the right ankle were taken today and show progressive consolidation at the fracture site compared with prior radiographs.  Plan to progress patient's weightbearing status to weightbearing as tolerated without crutches in the fracture boot.  Follow-up in 2 weeks for clinical recheck with x-rays and likely transition to regular shoe out of the fracture boot at that time.  No calf tenderness.  Negative Denna Haggard' sign on exam today.  Very minimal tenderness over the fracture site that is rated 2-3/10.  Follow-Up Instructions: No follow-ups on file.   Orders:  Orders Placed This Encounter  Procedures   XR Ankle Complete Right   No orders of the defined types were placed in this encounter.   Imaging: No results found.  PMFS History: Patient Active Problem List   Diagnosis Date Noted   Prediabetes 07/14/2020   Vitamin D insufficiency 07/14/2020   DOE (dyspnea on exertion) 03/14/2020   Acute pain of right knee 11/05/2016   Morbid obesity (HCC) 09/07/2015   GERD (gastroesophageal reflux disease) 09/07/2015   Seasonal allergies 09/07/2015   Attention deficit disorder 09/07/2015   Anxiety 09/07/2015   High  risk sexual behavior 09/07/2015   PVC (premature ventricular contraction) 05/04/2015   Benign essential HTN 05/04/2015   Hyperlipidemia 05/04/2015   Obstructive sleep apnea 05/04/2015   Past Medical History:  Diagnosis Date   Acute pain of right knee 11/05/2016   ADD (attention deficit disorder)    Allergy    Anxiety    Attention deficit disorder 09/07/2015   Back pain    Benign essential HTN 05/04/2015   Chest pain 05/04/2015   Excessive daytime sleepiness 05/04/2015   GERD (gastroesophageal reflux disease)    High cholesterol    High risk sexual behavior 09/07/2015   Hyperlipidemia 05/04/2015   Hypertension    Joint pain    Morbid obesity (HCC) 09/07/2015   Obesity    Obstructive sleep apnea 05/04/2015   Prediabetes    PVC (premature ventricular contraction) 05/04/2015   Seasonal allergies 09/07/2015   SOB (shortness of breath)     Family History  Problem Relation Age of Onset   Hypertension Mother    Diabetes Mother    High Cholesterol Mother    Anxiety disorder Mother    Sleep apnea Mother    Obesity Mother    Heart disease Father    Heart attack Father    High blood pressure Father    High Cholesterol Father     Past Surgical History:  Procedure Laterality Date   KNEE SURGERY Bilateral    WRIST FRACTURE SURGERY     Social History  Occupational History   Occupation: Biomedical scientist  Tobacco Use   Smoking status: Never   Smokeless tobacco: Never  Substance and Sexual Activity   Alcohol use: No    Alcohol/week: 0.0 standard drinks   Drug use: No   Sexual activity: Not Currently

## 2021-04-17 ENCOUNTER — Ambulatory Visit (INDEPENDENT_AMBULATORY_CARE_PROVIDER_SITE_OTHER): Payer: No Typology Code available for payment source | Admitting: Orthopedic Surgery

## 2021-04-17 ENCOUNTER — Other Ambulatory Visit: Payer: Self-pay

## 2021-04-17 ENCOUNTER — Ambulatory Visit (INDEPENDENT_AMBULATORY_CARE_PROVIDER_SITE_OTHER): Payer: No Typology Code available for payment source

## 2021-04-17 DIAGNOSIS — S8261XD Displaced fracture of lateral malleolus of right fibula, subsequent encounter for closed fracture with routine healing: Secondary | ICD-10-CM

## 2021-04-20 ENCOUNTER — Encounter: Payer: Self-pay | Admitting: Orthopedic Surgery

## 2021-04-20 NOTE — Progress Notes (Signed)
Post-Op Visit Note   Patient: Alexander Solomon           Date of Birth: [birth year]           MRN: [id number] Visit Date: 04/17/2021 PCP: Lupita Raider, MD   Assessment & Plan:  Chief Complaint:  Chief Complaint  Patient presents with   Other     Follow up ankle fracture   Visit Diagnoses:  1. Displaced fracture of lateral malleolus of right fibula, subsequent encounter for closed fracture with routine healing     Plan: Patient presents now 5 weeks out right ankle fracture lateral malleolus.  He has been full weightbearing in fracture boot.  Overall doing well.  On exam no calf tenderness negative Homans.  Radiographs show early callus formation around the lateral malleolar fracture.  Plan at this time is note for regular duty work but no uneven job site evaluation for 3 weeks.  Lace up ankle brace for 3 weeks then he can go into regular shoes.  Anticipate swelling for 2 to 3 months.  Follow-up as needed  Follow-Up Instructions: Return if symptoms worsen or fail to improve.   Orders:  Orders Placed This Encounter  Procedures   XR Ankle Complete Right   No orders of the defined types were placed in this encounter.   Imaging: No results found.  PMFS History: Patient Active Problem List   Diagnosis Date Noted   Prediabetes 07/14/2020   Vitamin D insufficiency 07/14/2020   DOE (dyspnea on exertion) 03/14/2020   Acute pain of right knee 11/05/2016   Morbid obesity (HCC) 09/07/2015   GERD (gastroesophageal reflux disease) 09/07/2015   Seasonal allergies 09/07/2015   Attention deficit disorder 09/07/2015   Anxiety 09/07/2015   High risk sexual behavior 09/07/2015   PVC (premature ventricular contraction) 05/04/2015   Benign essential HTN 05/04/2015   Hyperlipidemia 05/04/2015   Obstructive sleep apnea 05/04/2015   Past Medical History:  Diagnosis Date   Acute pain of right knee 11/05/2016   ADD (attention deficit disorder)    Allergy    Anxiety    Attention deficit  disorder 09/07/2015   Back pain    Benign essential HTN 05/04/2015   Chest pain 05/04/2015   Excessive daytime sleepiness 05/04/2015   GERD (gastroesophageal reflux disease)    High cholesterol    High risk sexual behavior 09/07/2015   Hyperlipidemia 05/04/2015   Hypertension    Joint pain    Morbid obesity (HCC) 09/07/2015   Obesity    Obstructive sleep apnea 05/04/2015   Prediabetes    PVC (premature ventricular contraction) 05/04/2015   Seasonal allergies 09/07/2015   SOB (shortness of breath)     Family History  Problem Relation Age of Onset   Hypertension Mother    Diabetes Mother    High Cholesterol Mother    Anxiety disorder Mother    Sleep apnea Mother    Obesity Mother    Heart disease Father    Heart attack Father    High blood pressure Father    High Cholesterol Father     Past Surgical History:  Procedure Laterality Date   KNEE SURGERY Bilateral    WRIST FRACTURE SURGERY     Social History   Occupational History   Occupation: Biomedical scientist  Tobacco Use   Smoking status: Never   Smokeless tobacco: Never  Substance and Sexual Activity   Alcohol use: No    Alcohol/week: 0.0 standard drinks   Drug use: No  Sexual activity: Not Currently

## 2021-06-02 ENCOUNTER — Ambulatory Visit (INDEPENDENT_AMBULATORY_CARE_PROVIDER_SITE_OTHER): Payer: No Typology Code available for payment source

## 2021-06-02 ENCOUNTER — Other Ambulatory Visit: Payer: Self-pay

## 2021-06-02 DIAGNOSIS — Z23 Encounter for immunization: Secondary | ICD-10-CM

## 2021-06-02 NOTE — Progress Notes (Signed)
[facility]  Name:  MATTOX BUSCAGLIA    MRN: [id number] DOB: Apr 27, 1986   06/02/2021  Alexander Solomon was observed post JYNNEOS immunization for 15 minutes without incident. He was provided with Vaccine Information Sheet and instruction to access the V-Safe system.   [name] was instructed to call 911 with any severe reactions post vaccine: Difficulty breathing  Swelling of face and throat  A fast heartbeat  A bad rash all over body  Dizziness and weakness     [name] T [name]

## 2021-06-05 ENCOUNTER — Other Ambulatory Visit: Payer: Self-pay | Admitting: Pharmacist

## 2021-06-05 DIAGNOSIS — Z79899 Other long term (current) drug therapy: Secondary | ICD-10-CM

## 2021-06-05 NOTE — Telephone Encounter (Signed)
Patient has appointment 9/27

## 2021-06-07 NOTE — Telephone Encounter (Signed)
Has an appt on 9/27

## 2021-06-19 ENCOUNTER — Other Ambulatory Visit (HOSPITAL_COMMUNITY): Payer: Self-pay

## 2021-06-19 ENCOUNTER — Other Ambulatory Visit: Payer: Self-pay | Admitting: Pharmacist

## 2021-06-19 DIAGNOSIS — Z79899 Other long term (current) drug therapy: Secondary | ICD-10-CM

## 2021-06-20 ENCOUNTER — Other Ambulatory Visit: Payer: Self-pay

## 2021-06-20 ENCOUNTER — Other Ambulatory Visit (HOSPITAL_COMMUNITY)
Admission: RE | Admit: 2021-06-20 | Discharge: 2021-06-20 | Disposition: A | Discharge status: Home / Self Care | Payer: No Typology Code available for payment source | Source: Ambulatory Visit | Attending: Infectious Disease | Admitting: Infectious Disease

## 2021-06-20 ENCOUNTER — Ambulatory Visit (INDEPENDENT_AMBULATORY_CARE_PROVIDER_SITE_OTHER): Payer: No Typology Code available for payment source | Admitting: Pharmacist

## 2021-06-20 DIAGNOSIS — Z79899 Other long term (current) drug therapy: Secondary | ICD-10-CM

## 2021-06-20 DIAGNOSIS — Z113 Encounter for screening for infections with a predominantly sexual mode of transmission: Secondary | ICD-10-CM | POA: Insufficient documentation

## 2021-06-20 NOTE — Progress Notes (Signed)
Date:  06/20/2021                      HPI: Alexander Solomon is a 35 y.o. male who presents to the [facility] pharmacy clinic for HIV PrEP follow-up.  Insured   [x]    Uninsured  []    Patient Active Problem List   Diagnosis Date Noted   Prediabetes 07/14/2020   Vitamin D insufficiency 07/14/2020   DOE (dyspnea on exertion) 03/14/2020   Acute pain of right knee 11/05/2016   Morbid obesity (HCC) 09/07/2015   GERD (gastroesophageal reflux disease) 09/07/2015   Seasonal allergies 09/07/2015   Attention deficit disorder 09/07/2015   Anxiety 09/07/2015   High risk sexual behavior 09/07/2015   PVC (premature ventricular contraction) 05/04/2015   Benign essential HTN 05/04/2015   Hyperlipidemia 05/04/2015   Obstructive sleep apnea 05/04/2015    Patient's Medications  New Prescriptions   No medications on file  Previous Medications   ACETAMINOPHEN-CODEINE 300-30 MG TABLET    Take 1 tablet by mouth every 12 (twelve) hours as needed. Okay to take NSAIDs with this medication   CYCLOBENZAPRINE (FLEXERIL) 10 MG TABLET    Take 10 mg by mouth 3 (three) times daily as needed for muscle spasms. Reported on 09/07/2015   DICLOFENAC (VOLTAREN) 50 MG EC TABLET    Take 50 mg by mouth 2 (two) times daily as needed.   EMTRICITABINE-TENOFOVIR AF (DESCOVY) 200-25 MG TABLET    Take 1 tablet by mouth daily.   FLUTICASONE (FLONASE) 50 MCG/ACT NASAL SPRAY    Place 2 sprays into both nostrils daily.   LISDEXAMFETAMINE (VYVANSE) 20 MG CAPSULE    Take 60 mg by mouth daily.   LORATADINE (CLARITIN) 10 MG TABLET    Take 10 mg by mouth daily.   LOSARTAN-HYDROCHLOROTHIAZIDE (HYZAAR) 100-25 MG PER TABLET    Take 1 tablet by mouth daily.   METFORMIN (GLUCOPHAGE) 500 MG TABLET    Take 1 tablet (500 mg total) by mouth daily before supper.   MOLNUPIRAVIR 200 MG CAPS    TAKE 4 CAPSULES (800 MG TOTAL) BY MOUTH 2 (TWO) TIMES DAILY FOR 5 DAYS.   MULTIPLE VITAMIN (MULTIVITAMIN) TABLET    Take 1 tablet by mouth daily.   OMEPRAZOLE  (PRILOSEC) 40 MG CAPSULE    Take 40 mg by mouth every evening.    SEMAGLUTIDE (RYBELSUS) 3 MG TABS    Take 4 tablets by mouth daily.   SIMVASTATIN (ZOCOR) 40 MG TABLET    Take 40 mg by mouth daily.   SUCRALFATE (CARAFATE) 1 G TABLET    Take 1 tablet by mouth 3 (three) times daily.   VITAMIN D, ERGOCALCIFEROL, (DRISDOL) 1.25 MG (50000 UNIT) CAPS CAPSULE    Take 1 capsule (50,000 Units total) by mouth every 7 (seven) days.  Modified Medications   No medications on file  Discontinued Medications   No medications on file    Allergies: No Known Allergies  Past Medical History: Past Medical History:  Diagnosis Date   Acute pain of right knee 11/05/2016   ADD (attention deficit disorder)    Allergy    Anxiety    Attention deficit disorder 09/07/2015   Back pain    Benign essential HTN 05/04/2015   Chest pain 05/04/2015   Excessive daytime sleepiness 05/04/2015   GERD (gastroesophageal reflux disease)    High cholesterol    High risk sexual behavior 09/07/2015   Hyperlipidemia 05/04/2015   Hypertension    Joint pain  Morbid obesity (HCC) 09/07/2015   Obesity    Obstructive sleep apnea 05/04/2015   Prediabetes    PVC (premature ventricular contraction) 05/04/2015   Seasonal allergies 09/07/2015   SOB (shortness of breath)     Social History: Social History   Socioeconomic History   Marital status: Divorced    Spouse name: katelin   Number of children: 1   Years of education: college   Highest education level: Not on file  Occupational History   Occupation: Biomedical scientist  Tobacco Use   Smoking status: Never   Smokeless tobacco: Never  Substance and Sexual Activity   Alcohol use: No    Alcohol/week: 0.0 standard drinks   Drug use: No   Sexual activity: Not Currently  Other Topics Concern   Not on file  Social History Narrative   Not on file   Social Determinants of Health   Financial Resource Strain: Not on file  Food Insecurity: Not on file   Transportation Needs: Not on file  Physical Activity: Not on file  Stress: Not on file  Social Connections: Not on file    [facility] HIV PREP FLOWSHEET RESULTS 05/05/2018 02/14/2018 01/17/2018  Insurance Status Insured Insured Insured  How did you hear? - Family Physician Referred from Dr Clelia Croft  Gender at birth Male Male Male  Gender identity cis-Male cis-Male cis-Male  Risk for HIV >5 partners in past 6 mos (regardless of condom use);Condomless vaginal or anal intercourse Condomless vaginal or anal intercourse;>5 partners in past 6 mos (regardless of condom use);Hx of STI Condomless vaginal or anal intercourse  Sex Partners Men only Men only Men only  # sex partners past 3-6 mos 4-6 4-6 1-3  Sex activity preferences Insertive and receptive Insertive and receptive;Oral Insertive and receptive;Oral  Condom use Yes Yes No  % condom use 67 95 -  Partners genders and ages - 41 20-24;M 25-29;M 51-49 M 20-24;M 25-29;M 30-49  Treated for STI? No Yes N/A  HIV symptoms? N/A N/A N/A  PrEP Eligibility CrCl >60 ml/min HIV negative;CrCl >60 ml/min;Substantial risk for HIV Substantial risk for HIV;HIV negative  Paper work received? - No No    Labs:  SCr: Lab Results  Component Value Date   CREATININE 0.73 (L) 07/13/2020   CREATININE 0.82 02/14/2018   CREATININE 0.82 01/17/2018   CREATININE 0.83 10/30/2016   CREATININE 0.80 03/08/2016   HIV Lab Results  Component Value Date   HIV NON-REACTIVE 03/22/2021   HIV NON-REACTIVE 12/13/2020   HIV NON-REACTIVE 05/05/2018   HIV NON-REACTIVE 02/14/2018   HIV NON-REACTIVE 01/17/2018   Hepatitis B Lab Results  Component Value Date   HEPBSAB NON-REACTIVE 01/17/2018   HEPBSAG NEGATIVE 09/07/2015   Hepatitis C No results found for: HEPCAB, HCVRNAPCRQN Hepatitis A Lab Results  Component Value Date   HAV REACTIVE (A) 01/17/2018   RPR and STI Lab Results  Component Value Date   LABRPR NON-REACTIVE 03/22/2021   LABRPR NON-REACTIVE 12/13/2020    LABRPR NON-REACTIVE 05/05/2018   LABRPR NON-REACTIVE 01/17/2018   LABRPR NON REAC 10/30/2016    STI Results GC CT  03/22/2021 Negative Negative  12/13/2020 Negative Negative  12/13/2020 Negative Negative  12/13/2020 Negative Negative  05/05/2018 Negative Negative  05/05/2018 Negative Negative  05/05/2018 Negative Negative  02/14/2018 Negative Negative  01/17/2018 Negative Negative  01/17/2018 **POSITIVE**(A) Negative  01/17/2018 Negative Negative  10/30/2016 Negative Negative  10/30/2016 Negative Negative  10/30/2016 Negative Negative  03/08/2016 Negative Negative  12/06/2015 Negative Negative    Assessment: Patient presents  to clinic today for 3 month PrEP follow-up on Descovy. Patient reports no missed doses or issues getting medications. Patient also reports more frequent sexual encounters. One of his partners notified him that they may have been exposed to another partner who may have been positive for both HIV and chlamydia and didn't use condoms during one of his encounters with this partner. Patient states the encounter with this partner wherein he didn't use condoms occurred May 28, 2021. When asked about acute HIV symptoms patient stated his throat has occasionally been sore and has had some dermatitis, but this is normal for him and no signs of other acute HIV symptoms. Patient was very anxious about his possible exposure, but I informed him that although he didn't use condoms at this encounter, he has been taking hs Descovy daily with no missed doses and has had no symptoms of acute HIV infection, therefore his risk of acquiring HIV is very low. I informed him that we would be obtaining an HIV RNA at the visit today so we can confirm he does not have HIV. Patient also opted to receive urine, rectal, and oral cytologies and RPR.   Patient was also offered the influenza vaccine during clinic today, which he declined. Patient reports no issues with his first monkeypox vaccine.   Plan: - F/U  07/14/21 for second monkeypox vaccine - F/U Cytologies- urine, rectal, oral  - F/U in pharmacy PrEP clinic in 3 months (09/19/21)  - F/U HIV Ab, HIV RNA, RPR  - Continue Descovy daily x 3 months (pending negative HIV Ab/RNA)  Jani Gravel, PharmD PGY-1 Acute Care Resident  06/20/2021 9:03 AM

## 2021-06-21 ENCOUNTER — Other Ambulatory Visit: Payer: Self-pay | Admitting: Pharmacist

## 2021-06-21 DIAGNOSIS — Z79899 Other long term (current) drug therapy: Secondary | ICD-10-CM

## 2021-06-21 LAB — CYTOLOGY, (ORAL, ANAL, URETHRAL) ANCILLARY ONLY
Chlamydia: NEGATIVE
Comment: NEGATIVE
Comment: NORMAL
Neisseria Gonorrhea: NEGATIVE

## 2021-06-21 LAB — URINE CYTOLOGY ANCILLARY ONLY
Chlamydia: NEGATIVE
Comment: NEGATIVE
Comment: NORMAL
Neisseria Gonorrhea: NEGATIVE

## 2021-06-21 MED ORDER — DESCOVY 200-25 MG PO TABS: 1.0000 | ORAL_TABLET | Freq: Every day | ORAL | 2 refills | Status: DC

## 2021-06-22 LAB — CYTOLOGY, (ORAL, ANAL, URETHRAL) ANCILLARY ONLY
Chlamydia: NEGATIVE
Comment: NEGATIVE
Comment: NORMAL
Neisseria Gonorrhea: NEGATIVE

## 2021-06-22 LAB — HEPATITIS C ANTIBODY
Hepatitis C Ab: NONREACTIVE
SIGNAL TO CUT-OFF: 0.01 (ref <1.00)

## 2021-06-22 LAB — HIV ANTIBODY (ROUTINE TESTING W REFLEX): HIV 1&2 Ab, 4th Generation: NONREACTIVE

## 2021-06-22 LAB — RPR: RPR Ser Ql: NONREACTIVE

## 2021-06-22 LAB — HEPATITIS B SURFACE ANTIBODY,QUALITATIVE: Hep B S Ab: NONREACTIVE

## 2021-06-22 LAB — HIV-1 RNA QUANT-NO REFLEX-BLD
HIV 1 RNA Quant: NOT DETECTED Copies/mL
HIV-1 RNA Quant, Log: NOT DETECTED Log cps/mL

## 2021-06-23 ENCOUNTER — Encounter: Payer: Self-pay | Admitting: Pharmacist

## 2021-07-14 ENCOUNTER — Ambulatory Visit: Payer: No Typology Code available for payment source

## 2021-09-07 ENCOUNTER — Other Ambulatory Visit: Payer: Self-pay | Admitting: Pharmacist

## 2021-09-07 DIAGNOSIS — Z79899 Other long term (current) drug therapy: Secondary | ICD-10-CM

## 2021-09-19 ENCOUNTER — Ambulatory Visit: Payer: No Typology Code available for payment source | Admitting: Pharmacist

## 2021-09-27 ENCOUNTER — Other Ambulatory Visit: Payer: Self-pay

## 2021-09-27 ENCOUNTER — Ambulatory Visit (INDEPENDENT_AMBULATORY_CARE_PROVIDER_SITE_OTHER): Payer: No Typology Code available for payment source | Admitting: Pharmacist

## 2021-09-27 ENCOUNTER — Other Ambulatory Visit (HOSPITAL_COMMUNITY)
Admission: RE | Admit: 2021-09-27 | Discharge: 2021-09-27 | Disposition: A | Discharge status: Home / Self Care | Payer: No Typology Code available for payment source | Source: Ambulatory Visit | Attending: Infectious Disease | Admitting: Infectious Disease

## 2021-09-27 ENCOUNTER — Other Ambulatory Visit (HOSPITAL_COMMUNITY): Payer: Self-pay

## 2021-09-27 DIAGNOSIS — Z79899 Other long term (current) drug therapy: Secondary | ICD-10-CM | POA: Diagnosis present

## 2021-09-27 NOTE — Progress Notes (Signed)
Date:  09/27/2021   HPI: Alexander Solomon is a 36 y.o. male who presents to the [facility] pharmacy clinic for HIV PrEP follow-up.  Insured   [x]    Uninsured  []    Patient Active Problem List   Diagnosis Date Noted   Prediabetes 07/14/2020   Vitamin D insufficiency 07/14/2020   DOE (dyspnea on exertion) 03/14/2020   Acute pain of right knee 11/05/2016   Morbid obesity (HCC) 09/07/2015   GERD (gastroesophageal reflux disease) 09/07/2015   Seasonal allergies 09/07/2015   Attention deficit disorder 09/07/2015   Anxiety 09/07/2015   High risk sexual behavior 09/07/2015   PVC (premature ventricular contraction) 05/04/2015   Benign essential HTN 05/04/2015   Hyperlipidemia 05/04/2015   Obstructive sleep apnea 05/04/2015    Patient's Medications  New Prescriptions   No medications on file  Previous Medications   ACETAMINOPHEN-CODEINE 300-30 MG TABLET    Take 1 tablet by mouth every 12 (twelve) hours as needed. Okay to take NSAIDs with this medication   CYCLOBENZAPRINE (FLEXERIL) 10 MG TABLET    Take 10 mg by mouth 3 (three) times daily as needed for muscle spasms. Reported on 09/07/2015   DICLOFENAC (VOLTAREN) 50 MG EC TABLET    Take 50 mg by mouth 2 (two) times daily as needed.   EMTRICITABINE-TENOFOVIR AF (DESCOVY) 200-25 MG TABLET    Take 1 tablet by mouth daily.   FLUTICASONE (FLONASE) 50 MCG/ACT NASAL SPRAY    Place 2 sprays into both nostrils daily.   LISDEXAMFETAMINE (VYVANSE) 20 MG CAPSULE    Take 60 mg by mouth daily.   LORATADINE (CLARITIN) 10 MG TABLET    Take 10 mg by mouth daily.   LOSARTAN-HYDROCHLOROTHIAZIDE (HYZAAR) 100-25 MG PER TABLET    Take 1 tablet by mouth daily.   METFORMIN (GLUCOPHAGE) 500 MG TABLET    Take 1 tablet (500 mg total) by mouth daily before supper.   MOLNUPIRAVIR 200 MG CAPS    TAKE 4 CAPSULES (800 MG TOTAL) BY MOUTH 2 (TWO) TIMES DAILY FOR 5 DAYS.   MULTIPLE VITAMIN (MULTIVITAMIN) TABLET    Take 1 tablet by mouth daily.   OMEPRAZOLE (PRILOSEC) 40 MG  CAPSULE    Take 40 mg by mouth every evening.    SEMAGLUTIDE (RYBELSUS) 3 MG TABS    Take 4 tablets by mouth daily.   SIMVASTATIN (ZOCOR) 40 MG TABLET    Take 40 mg by mouth daily.   SUCRALFATE (CARAFATE) 1 G TABLET    Take 1 tablet by mouth 3 (three) times daily.   VITAMIN D, ERGOCALCIFEROL, (DRISDOL) 1.25 MG (50000 UNIT) CAPS CAPSULE    Take 1 capsule (50,000 Units total) by mouth every 7 (seven) days.  Modified Medications   No medications on file  Discontinued Medications   No medications on file    Allergies: No Known Allergies  Past Medical History: Past Medical History:  Diagnosis Date   Acute pain of right knee 11/05/2016   ADD (attention deficit disorder)    Allergy    Anxiety    Attention deficit disorder 09/07/2015   Back pain    Benign essential HTN 05/04/2015   Chest pain 05/04/2015   Excessive daytime sleepiness 05/04/2015   GERD (gastroesophageal reflux disease)    High cholesterol    High risk sexual behavior 09/07/2015   Hyperlipidemia 05/04/2015   Hypertension    Joint pain    Morbid obesity (HCC) 09/07/2015   Obesity    Obstructive sleep apnea 05/04/2015  Prediabetes    PVC (premature ventricular contraction) 05/04/2015   Seasonal allergies 09/07/2015   SOB (shortness of breath)     Social History: Social History   Socioeconomic History   Marital status: Divorced    Spouse name: katelin   Number of children: 1   Years of education: college   Highest education level: Not on file  Occupational History   Occupation: Biomedical scientistegional Safety Manager  Tobacco Use   Smoking status: Never   Smokeless tobacco: Never  Substance and Sexual Activity   Alcohol use: No    Alcohol/week: 0.0 standard drinks   Drug use: No   Sexual activity: Not Currently  Other Topics Concern   Not on file  Social History Narrative   Not on file   Social Determinants of Health   Financial Resource Strain: Not on file  Food Insecurity: Not on file  Transportation Needs: Not on  file  Physical Activity: Not on file  Stress: Not on file  Social Connections: Not on file    [facility] PRE[facility] FL[facility]HEET RESULTS 05/05/2018 02/14/2018 01/17/2018  Insurance Status Insured Insured Insured  How did you hear? - Family Physician Referred from Dr Clelia CroftShaw  Gender at birth Male Male Male  Gender identity cis-Male cis-Male cis-Male  Risk for HIV >5 partners in past 6 mos (regardless of condom use);Condomless vaginal or anal intercourse Condomless vaginal or anal intercourse;>5 partners in past 6 mos (regardless of condom use);Hx of STI Condomless vaginal or anal intercourse  Sex Partners Men only Men only Men only  # sex partners past 3-6 mos 4-6 4-6 1-3  Sex activity preferences Insertive and receptive Insertive and receptive;Oral Insertive and receptive;Oral  Condom use Yes Yes No  % condom use 5190 95 -  Partners genders and ages - 16M 20-24;M 25-29;M 4930-49 M 20-24;M 25-29;M 30-49  Treated for STI? No Yes N/A  HIV symptoms? N/A N/A N/A  PrEP Eligibility CrCl >60 ml/min HIV negative;CrCl >60 ml/min;Substantial risk for HIV Substantial risk for HIV;HIV negative  Paper work received? - No No    Labs:  SCr: Lab Results  Component Value Date   CREATININE 0.73 (L) 07/13/2020   CREATININE 0.82 02/14/2018   CREATININE 0.82 01/17/2018   CREATININE 0.83 10/30/2016   CREATININE 0.80 03/08/2016   HIV Lab Results  Component Value Date   HIV NON-REACTIVE 06/20/2021   HIV NON-REACTIVE 03/22/2021   HIV NON-REACTIVE 12/13/2020   HIV NON-REACTIVE 05/05/2018   HIV NON-REACTIVE 02/14/2018   Hepatitis B Lab Results  Component Value Date   HEPBSAB NON-REACTIVE 06/20/2021   HEPBSAG NEGATIVE 09/07/2015   Hepatitis C Lab Results  Component Value Date   HEPCAB NON-REACTIVE 06/20/2021   Hepatitis A Lab Results  Component Value Date   HAV REACTIVE (A) 01/17/2018   RPR and STI Lab Results  Component Value Date   LABRPR NON-REACTIVE 06/20/2021   LABRPR NON-REACTIVE 03/22/2021    LABRPR NON-REACTIVE 12/13/2020   LABRPR NON-REACTIVE 05/05/2018   LABRPR NON-REACTIVE 01/17/2018    Assessment: Alexander Solomon presents to clinic today for PrEP follow-up. He has no adherence or adverse event issues. Patient states he only has 1 new sexual partner and requested urine/oral/rectal cytologies today. Will also check Bmet today. Patient states he has plenty of Descovy left since [facility] since refills early. Will refill if HIV test is negative. Will follow up in 3 months.  Plan: Check HIV ab If HIV ab, refill Descovy x 3 months Follow-up with Cassie on 4/12 at 9:15am  Margarite Gouge, PharmD, CPP Clinical Pharmacist Practitioner Infectious Diseases Clinical Pharmacist [facility] 09/27/2021, 8:43 AM

## 2021-09-28 LAB — HIV ANTIBODY (ROUTINE TESTING W REFLEX): HIV 1&2 Ab, 4th Generation: NONREACTIVE

## 2021-09-28 LAB — CYTOLOGY, (ORAL, ANAL, URETHRAL) ANCILLARY ONLY
Chlamydia: NEGATIVE
Chlamydia: NEGATIVE
Comment: NEGATIVE
Comment: NEGATIVE
Comment: NORMAL
Comment: NORMAL
Neisseria Gonorrhea: NEGATIVE
Neisseria Gonorrhea: NEGATIVE

## 2021-09-28 LAB — BASIC METABOLIC PANEL
BUN: 20 mg/dL (ref 7–25)
CO2: 28 mmol/L (ref 20–32)
Calcium: 9.4 mg/dL (ref 8.6–10.3)
Chloride: 101 mmol/L (ref 98–110)
Creat: 0.77 mg/dL (ref 0.60–1.26)
Glucose, Bld: 143 mg/dL — ABNORMAL HIGH (ref 65–99)
Potassium: 3.8 mmol/L (ref 3.5–5.3)
Sodium: 138 mmol/L (ref 135–146)

## 2021-09-28 LAB — URINE CYTOLOGY ANCILLARY ONLY
Chlamydia: NEGATIVE
Comment: NEGATIVE
Comment: NORMAL
Neisseria Gonorrhea: NEGATIVE

## 2021-09-29 ENCOUNTER — Other Ambulatory Visit: Payer: Self-pay | Admitting: Pharmacist

## 2021-09-29 DIAGNOSIS — Z79899 Other long term (current) drug therapy: Secondary | ICD-10-CM

## 2021-09-29 MED ORDER — DESCOVY 200-25 MG PO TABS: 1.0000 | ORAL_TABLET | Freq: Every day | ORAL | 2 refills | Status: DC

## 2022-01-03 ENCOUNTER — Other Ambulatory Visit (HOSPITAL_COMMUNITY)
Admission: RE | Admit: 2022-01-03 | Discharge: 2022-01-03 | Disposition: A | Discharge status: Home / Self Care | Payer: No Typology Code available for payment source | Source: Ambulatory Visit | Attending: Infectious Disease | Admitting: Infectious Disease

## 2022-01-03 ENCOUNTER — Other Ambulatory Visit: Payer: Self-pay

## 2022-01-03 ENCOUNTER — Ambulatory Visit (INDEPENDENT_AMBULATORY_CARE_PROVIDER_SITE_OTHER): Payer: No Typology Code available for payment source | Admitting: Pharmacist

## 2022-01-03 DIAGNOSIS — Z113 Encounter for screening for infections with a predominantly sexual mode of transmission: Secondary | ICD-10-CM | POA: Insufficient documentation

## 2022-01-03 DIAGNOSIS — Z23 Encounter for immunization: Secondary | ICD-10-CM

## 2022-01-03 DIAGNOSIS — Z79899 Other long term (current) drug therapy: Secondary | ICD-10-CM | POA: Diagnosis not present

## 2022-01-03 MED ORDER — DESCOVY 200-25 MG PO TABS: 1.0000 | ORAL_TABLET | Freq: Every day | ORAL | 2 refills | Status: DC

## 2022-01-03 NOTE — Progress Notes (Signed)
? ?Date:  01/03/2022  ? ?HPI: Alexander Solomon is a 36 y.o. male who presents to the [facility] pharmacy clinic for HIV PrEP follow-up. ? ?Insured   [x]    Uninsured  []   ? ?Patient Active Problem List  ? Diagnosis Date Noted  ? Prediabetes 07/14/2020  ? Vitamin D insufficiency 07/14/2020  ? DOE (dyspnea on exertion) 03/14/2020  ? Acute pain of right knee 11/05/2016  ? Morbid obesity (HCC) 09/07/2015  ? GERD (gastroesophageal reflux disease) 09/07/2015  ? Seasonal allergies 09/07/2015  ? Attention deficit disorder 09/07/2015  ? Anxiety 09/07/2015  ? High risk sexual behavior 09/07/2015  ? PVC (premature ventricular contraction) 05/04/2015  ? Benign essential HTN 05/04/2015  ? Hyperlipidemia 05/04/2015  ? Obstructive sleep apnea 05/04/2015  ? ? ?Patient's Medications  ?New Prescriptions  ? No medications on file  ?Previous Medications  ? ACETAMINOPHEN-CODEINE 300-30 MG TABLET    Take 1 tablet by mouth every 12 (twelve) hours as needed. Okay to take NSAIDs with this medication  ? CYCLOBENZAPRINE (FLEXERIL) 10 MG TABLET    Take 10 mg by mouth 3 (three) times daily as needed for muscle spasms. Reported on 09/07/2015  ? DICLOFENAC (VOLTAREN) 50 MG EC TABLET    Take 50 mg by mouth 2 (two) times daily as needed.  ? FLUTICASONE (FLONASE) 50 MCG/ACT NASAL SPRAY    Place 2 sprays into both nostrils daily.  ? LISDEXAMFETAMINE (VYVANSE) 20 MG CAPSULE    Take 60 mg by mouth daily.  ? LORATADINE (CLARITIN) 10 MG TABLET    Take 10 mg by mouth daily.  ? LOSARTAN-HYDROCHLOROTHIAZIDE (HYZAAR) 100-25 MG PER TABLET    Take 1 tablet by mouth daily.  ? METFORMIN (GLUCOPHAGE) 500 MG TABLET    Take 1 tablet (500 mg total) by mouth daily before supper.  ? MULTIPLE VITAMIN (MULTIVITAMIN) TABLET    Take 1 tablet by mouth daily.  ? OMEPRAZOLE (PRILOSEC) 40 MG CAPSULE    Take 40 mg by mouth every evening.   ? SEMAGLUTIDE (RYBELSUS) 3 MG TABS    Take 4 tablets by mouth daily.  ? SIMVASTATIN (ZOCOR) 40 MG TABLET    Take 40 mg by mouth daily.  ? SUCRALFATE  (CARAFATE) 1 G TABLET    Take 1 tablet by mouth 3 (three) times daily.  ? VITAMIN D, ERGOCALCIFEROL, (DRISDOL) 1.25 MG (50000 UNIT) CAPS CAPSULE    Take 1 capsule (50,000 Units total) by mouth every 7 (seven) days.  ?Modified Medications  ? Modified Medication Previous Medication  ? EMTRICITABINE-TENOFOVIR AF (DESCOVY) 200-25 MG TABLET emtricitabine-tenofovir AF (DESCOVY) 200-25 MG tablet  ?    Take 1 tablet by mouth daily.    Take 1 tablet by mouth daily.  ?Discontinued Medications  ? No medications on file  ? ? ?Allergies: ?No Known Allergies ? ?Past Medical History: ?Past Medical History:  ?Diagnosis Date  ? Acute pain of right knee 11/05/2016  ? ADD (attention deficit disorder)   ? Allergy   ? Anxiety   ? Attention deficit disorder 09/07/2015  ? Back pain   ? Benign essential HTN 05/04/2015  ? Chest pain 05/04/2015  ? Excessive daytime sleepiness 05/04/2015  ? GERD (gastroesophageal reflux disease)   ? High cholesterol   ? High risk sexual behavior 09/07/2015  ? Hyperlipidemia 05/04/2015  ? Hypertension   ? Joint pain   ? Morbid obesity (HCC) 09/07/2015  ? Obesity   ? Obstructive sleep apnea 05/04/2015  ? Prediabetes   ? PVC (premature ventricular  contraction) 05/04/2015  ? Seasonal allergies 09/07/2015  ? SOB (shortness of breath)   ? ? ?Social History: ?Social History  ? ?Socioeconomic History  ? Marital status: Divorced  ?  Spouse name: katelin  ? Number of children: 1  ? Years of education: college  ? Highest education level: Not on file  ?Occupational History  ? Occupation: Biomedical scientistegional Safety Manager  ?Tobacco Use  ? Smoking status: Never  ? Smokeless tobacco: Never  ?Substance and Sexual Activity  ? Alcohol use: No  ?  Alcohol/week: 0.0 standard drinks  ? Drug use: No  ? Sexual activity: Not Currently  ?Other Topics Concern  ? Not on file  ?Social History Narrative  ? Not on file  ? ?Social Determinants of Health  ? ?Financial Resource Strain: Not on file  ?Food Insecurity: Not on file  ?Transportation Needs: Not on  file  ?Physical Activity: Not on file  ?Stress: Not on file  ?Social Connections: Not on file  ? ? ? ?  05/05/2018  ? 11:42 AM 05/05/2018  ?  9:35 AM 02/14/2018  ?  9:14 AM 01/17/2018  ?  9:07 AM  ?CHL HIV PREP FLOWSHEET RESULTS  ?Insurance Status  Insured Insured Insured  ?How did you hear?   Family Physician Referred from Dr Clelia CroftShaw  ?Gender at birth  Male Male Male  ?Gender identity  cis-Male cis-Male cis-Male  ?Risk for HIV >5 partners in past 6 mos (regardless of condom use);Condomless vaginal or anal intercourse  Condomless vaginal or anal intercourse;>5 partners in past 6 mos (regardless of condom use);Hx of STI Condomless vaginal or anal intercourse  ?Sex Partners  Men only Men only Men only  ?# sex partners past 3-6 mos  4-6 4-6 1-3  ?Sex activity preferences  Insertive and receptive Insertive and receptive;Oral Insertive and receptive;Oral  ?Condom use  Yes Yes No  ?% condom use  90 95   ?Partners genders and ages   M 20-24;M 25-29;M 3530-49 M 20-24;M 25-29;M 30-49  ?Treated for STI?  No Yes N/A  ?HIV symptoms?  N/A N/A N/A  ?PrEP Eligibility  CrCl >60 ml/min HIV negative;CrCl >60 ml/min;Substantial risk for HIV Substantial risk for HIV;HIV negative  ?Paper work received?   No No  ? ? ?Labs: ? ?SCr: ?Lab Results  ?Component Value Date  ? CREATININE 0.77 09/27/2021  ? CREATININE 0.73 (L) 07/13/2020  ? CREATININE 0.82 02/14/2018  ? CREATININE 0.82 01/17/2018  ? CREATININE 0.83 10/30/2016  ? ?HIV ?Lab Results  ?Component Value Date  ? HIV NON-REACTIVE 09/27/2021  ? HIV NON-REACTIVE 06/20/2021  ? HIV NON-REACTIVE 03/22/2021  ? HIV NON-REACTIVE 12/13/2020  ? HIV NON-REACTIVE 05/05/2018  ? ?Hepatitis B ?Lab Results  ?Component Value Date  ? HEPBSAB NON-REACTIVE 06/20/2021  ? HEPBSAG NEGATIVE 09/07/2015  ? ?Hepatitis C ?Lab Results  ?Component Value Date  ? HEPCAB NON-REACTIVE 06/20/2021  ? ?Hepatitis A ?Lab Results  ?Component Value Date  ? HAV REACTIVE (A) 01/17/2018  ? ?RPR and STI ?Lab Results  ?Component Value Date   ? LABRPR NON-REACTIVE 06/20/2021  ? LABRPR NON-REACTIVE 03/22/2021  ? LABRPR NON-REACTIVE 12/13/2020  ? LABRPR NON-REACTIVE 05/05/2018  ? LABRPR NON-REACTIVE 01/17/2018  ? ? ?STI Results GC CT  ?09/27/2021 ? 8:51 AM Negative    ? Negative    ? Negative   Negative    ? Negative    ? Negative    ?06/20/2021 ? 9:14 AM Negative    ? Negative    ? Negative  Negative    ? Negative    ? Negative    ?03/22/2021 ?11:49 AM Negative   Negative    ?12/13/2020 ? 9:33 AM Negative    ? Negative    ? Negative   Negative    ? Negative    ? Negative    ?05/05/2018 ?12:00 AM Negative    ? Negative    ? Negative   Negative    ? Negative    ? Negative    ?02/14/2018 ?12:00 AM Negative   Negative    ?01/17/2018 ?12:00 AM Negative    ? **POSITIVE**    ? Negative   Negative    ? Negative    ? Negative    ?10/30/2016 ?12:00 AM Negative    ? Negative    ? Negative   Negative    ? Negative    ? Negative    ?03/08/2016 ?12:00 AM Negative   Negative    ?12/06/2015 ?12:00 AM Negative   Negative    ? ? ?Assessment: ?Kamaron presents to clinic for PrEP follow-up. He is currently taking Descovy. He reports tolerating medication well and denies missing doses. [facility] is [facility]. He says he is almost out of medicine. He was seen by his PCP at [facility] recently and was prescribed mouthwashes for swollen tongue. He reports no other new medications since last visit. He notes a small knot below his right jaw line. Appears to be a possible swollen lymph node, patient will follow up with PCP and will check for STIs today. He gets his lipids checked regularly by his PCP. His Hepatitis B serology indicates non-immunity despite receiving HepB vaccines in the past. He is agreeable to getting Heplisav-B vaccine and STI testing today. Will check HIV antibody today.   ? ?Plan: ?-Check HIV antibody, RPR, urine cytology, rectal/pharyngeal GC/CT swabs ?-Gave 1st dose of Heplisav-B vaccine today. ?-Send in Descovy x3 months if HIV negative ?-Follow up STI  results to see if treatment is needed ?-Follow up on 5/15 with Cassie for 2nd dose of Heplisav-B vaccine ?-Follow up on 7/18 with Cassie for 3 month PrEP f/u ?-Call or contact if any questions ? ?Alvan Dame

## 2022-01-04 LAB — CYTOLOGY, (ORAL, ANAL, URETHRAL) ANCILLARY ONLY
Chlamydia: NEGATIVE
Chlamydia: NEGATIVE
Comment: NEGATIVE
Comment: NEGATIVE
Comment: NORMAL
Comment: NORMAL
Neisseria Gonorrhea: NEGATIVE
Neisseria Gonorrhea: NEGATIVE

## 2022-01-04 LAB — RPR: RPR Ser Ql: NONREACTIVE

## 2022-01-04 LAB — HIV ANTIBODY (ROUTINE TESTING W REFLEX): HIV 1&2 Ab, 4th Generation: NONREACTIVE

## 2022-01-04 LAB — URINE CYTOLOGY ANCILLARY ONLY
Chlamydia: NEGATIVE
Comment: NEGATIVE
Comment: NORMAL
Neisseria Gonorrhea: NEGATIVE

## 2022-02-05 ENCOUNTER — Other Ambulatory Visit: Payer: Self-pay

## 2022-02-05 ENCOUNTER — Ambulatory Visit (INDEPENDENT_AMBULATORY_CARE_PROVIDER_SITE_OTHER): Payer: No Typology Code available for payment source | Admitting: Pharmacist

## 2022-02-05 DIAGNOSIS — Z23 Encounter for immunization: Secondary | ICD-10-CM | POA: Diagnosis not present

## 2022-02-05 NOTE — Progress Notes (Signed)
? ?  [facility] Pharmacy Vaccination Visit ? ?HPI: Alexander Solomon is a 36 y.o. male who presents to the [facility] pharmacy clinic for Hepatitis B vaccine administration. ? ?Hepatitis B ?Lab Results  ?Component Value Date  ? HEPBSAB NON-REACTIVE 06/20/2021  ? ?Lab Results  ?Component Value Date  ? HEPBSAG NEGATIVE 09/07/2015  ? ? ?Hepatitis C ?Lab Results  ?Component Value Date  ? HCVAB NEGATIVE 10/30/2016  ? ? ?Hepatitis A ?Lab Results  ?Component Value Date  ? HAV REACTIVE (A) 01/17/2018  ? ? ?Assessment & Plan: ?- Administered Heplisav B #2/2 vaccine ?- Patient tolerated well ?- Follow up for PrEP visit in July ? ?Amaal Dimartino L. Karmen Altamirano, PharmD, BCIDP, AAHIVP, CPP ?Clinical Pharmacist Practitioner ?Infectious Diseases Clinical Pharmacist ?[facility] ?02/05/2022, 9:57 AM ? ?

## 2022-03-19 ENCOUNTER — Encounter: Payer: Self-pay | Admitting: Orthopedic Surgery

## 2022-03-19 ENCOUNTER — Ambulatory Visit: Payer: Self-pay

## 2022-03-19 ENCOUNTER — Ambulatory Visit (INDEPENDENT_AMBULATORY_CARE_PROVIDER_SITE_OTHER): Payer: No Typology Code available for payment source | Admitting: Orthopedic Surgery

## 2022-03-19 DIAGNOSIS — M25571 Pain in right ankle and joints of right foot: Secondary | ICD-10-CM

## 2022-03-19 NOTE — Progress Notes (Signed)
Office Visit Note   Patient: Alexander Solomon           Date of Birth: 05-03-1986           MRN: [id number] Visit Date: 03/19/2022 Requested by: Lupita Raider, MD [address] [place],  [place] [address] PCP: Lupita Raider, MD  Subjective: Chief Complaint  Patient presents with   Right Ankle - Pain    HPI: Alexander Solomon is a 36 y.o. male who presents to the office complaining of right ankle pain.  Patient complains of medial and lateral pain in the right ankle.  He has history of prior right ankle fracture that he sustained in summer 2022 and had nonoperative treatment.  He states that occasionally pain has gotten so bad recently to make him limp and keep him from walking.  He denies any mechanical symptoms but does note instability of the ankle where he feels like it wants to give out on him.  He also notes a decrease in his dorsiflexion range of motion.  Occasional swelling based on his activity.  Takes ibuprofen as needed for pain control.  He is very active for his job which involves a lot of walking and this pain is keeping him from being as active as he would like to.  Feels stiff with immobility..                ROS: All systems reviewed are negative as they relate to the chief complaint within the history of present illness.  Patient denies fevers or chills.  Assessment & Plan: Visit Diagnoses:  1. Pain in right ankle and joints of right foot     Plan: Patient is a 36 year old male who presents for evaluation of right ankle pain.  Patient has history of right ankle fracture that was treated nonoperatively with good fracture healing last year.  He has noticed more more pain in the right ankle over the last several months with increased pain with activity, swelling, instability of the ankle.  Does have radiographs taken today that demonstrate osteophytic lipping of the medial malleolus with some irregularity of the superior lateral talar dome.  With continued instability  and worsening symptoms, plan for further evaluation with MRI of the right ankle.  Want to evaluate for posterior tib tendon deficiency as well given his medial sided pain.  Talar dome lesion another possibility.  That is most commonly on the medial but could be on the lateral side as well.  Follow-Up Instructions: No follow-ups on file.   Orders:  Orders Placed This Encounter  Procedures   XR Ankle Complete Right   MR Ankle Right w/o contrast   No orders of the defined types were placed in this encounter.     Procedures: No procedures performed   Clinical Data: No additional findings.  Objective: Vital Signs: There were no vitals taken for this visit.  Physical Exam:  Constitutional: Patient appears well-developed HEENT:  Head: Normocephalic Eyes:EOM are normal Neck: Normal range of motion Cardiovascular: Normal rate Pulmonary/chest: Effort normal Neurologic: Patient is alert Skin: Skin is warm Psychiatric: Patient has normal mood and affect  Ortho Exam: Ortho exam demonstrates right ankle with decreased passive dorsiflexion compared with the contralateral side by about 10 degrees.  Palpable DP pulse rated 2+.  Intact active ankle dorsiflexion, plantarflexion, inversion, eversion though there is a slight weakness of inversion and eversion actively compared with the contralateral side.  Tenderness over the ATFL and anterior joint line  of the ankle as well as just anterior to the medial malleolus.  No tenderness over the lateral malleolus, medial malleolus, Lisfranc complex, fifth metatarsal base, Achilles tendon, Achilles tendon insertion.  No calf tenderness.  Negative Homans' sign.  Stable to anterior drawer.  Stable syndesmosis.  Mild swelling noted in the lateral aspect of the ankle.  Specialty Comments:  No specialty comments available.  Imaging: No results found.   PMFS History: Patient Active Problem List   Diagnosis Date Noted   Prediabetes 07/14/2020   Vitamin  D insufficiency 07/14/2020   DOE (dyspnea on exertion) 03/14/2020   Acute pain of right knee 11/05/2016   Morbid obesity (HCC) 09/07/2015   GERD (gastroesophageal reflux disease) 09/07/2015   Seasonal allergies 09/07/2015   Attention deficit disorder 09/07/2015   Anxiety 09/07/2015   High risk sexual behavior 09/07/2015   PVC (premature ventricular contraction) 05/04/2015   Benign essential HTN 05/04/2015   Hyperlipidemia 05/04/2015   Obstructive sleep apnea 05/04/2015   Past Medical History:  Diagnosis Date   Acute pain of right knee 11/05/2016   ADD (attention deficit disorder)    Allergy    Anxiety    Attention deficit disorder 09/07/2015   Back pain    Benign essential HTN 05/04/2015   Chest pain 05/04/2015   Excessive daytime sleepiness 05/04/2015   GERD (gastroesophageal reflux disease)    High cholesterol    High risk sexual behavior 09/07/2015   Hyperlipidemia 05/04/2015   Hypertension    Joint pain    Morbid obesity (HCC) 09/07/2015   Obesity    Obstructive sleep apnea 05/04/2015   Prediabetes    PVC (premature ventricular contraction) 05/04/2015   Seasonal allergies 09/07/2015   SOB (shortness of breath)     Family History  Problem Relation Age of Onset   Hypertension Mother    Diabetes Mother    High Cholesterol Mother    Anxiety disorder Mother    Sleep apnea Mother    Obesity Mother    Heart disease Father    Heart attack Father    High blood pressure Father    High Cholesterol Father     Past Surgical History:  Procedure Laterality Date   KNEE SURGERY Bilateral    WRIST FRACTURE SURGERY     Social History   Occupational History   Occupation: Biomedical scientist  Tobacco Use   Smoking status: Never   Smokeless tobacco: Never  Substance and Sexual Activity   Alcohol use: No    Alcohol/week: 0.0 standard drinks of alcohol   Drug use: No   Sexual activity: Not Currently

## 2022-03-25 ENCOUNTER — Ambulatory Visit
Admission: RE | Admit: 2022-03-25 | Discharge: 2022-03-25 | Disposition: A | Discharge status: Home / Self Care | Payer: No Typology Code available for payment source | Source: Ambulatory Visit | Attending: Orthopedic Surgery | Admitting: Orthopedic Surgery

## 2022-03-25 DIAGNOSIS — M25571 Pain in right ankle and joints of right foot: Secondary | ICD-10-CM

## 2022-03-29 NOTE — Progress Notes (Signed)
This is the patient I was telling you about.  Let me know what you think and I can call him and have him follow-up with you.  If you think you can help him with what ever you do for this problem.  Thanks

## 2022-03-31 NOTE — Progress Notes (Signed)
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  Thanks and then it is okay for him the see due to instead of seeing me on the 17th if that is okay with him.

## 2022-04-05 ENCOUNTER — Encounter: Payer: Self-pay | Admitting: Pharmacist

## 2022-04-05 ENCOUNTER — Telehealth: Payer: Self-pay | Admitting: Orthopedic Surgery

## 2022-04-05 NOTE — Telephone Encounter (Signed)
Pt called requesting a call back from Dr. August Saucer or Kathryne Gin. Pt states Dr August Saucer called him yesterday and left a vm about 5 pm stating he need to see Dr Lajoyce Corners. No notes are on pt's chart to see Dr Lajoyce Corners per Dr. August Saucer. Pt has an appt with Dr. August Saucer 7/17. Pt contact pt about this matter at [phone].

## 2022-04-05 NOTE — Telephone Encounter (Signed)
Dr August Saucer called patient last night and left a voicemail for him. He indeed does need to see Dr Lajoyce Corners.  Tried calling him from [place] office and cannot get patient to answer. Will you please call him and schedule him with Dr Lajoyce Corners next week per Dr August Saucer? Thanks.

## 2022-04-05 NOTE — Telephone Encounter (Signed)
SW pt, he is scheduled with Dr. Lajoyce Corners on 04/09/22

## 2022-04-06 ENCOUNTER — Other Ambulatory Visit: Payer: Self-pay | Admitting: Pharmacist

## 2022-04-06 DIAGNOSIS — Z79899 Other long term (current) drug therapy: Secondary | ICD-10-CM

## 2022-04-06 MED ORDER — DESCOVY 200-25 MG PO TABS: 1.0000 | ORAL_TABLET | Freq: Every day | ORAL | 0 refills | Status: DC

## 2022-04-09 ENCOUNTER — Ambulatory Visit: Payer: No Typology Code available for payment source | Admitting: Orthopedic Surgery

## 2022-04-09 ENCOUNTER — Ambulatory Visit (INDEPENDENT_AMBULATORY_CARE_PROVIDER_SITE_OTHER): Payer: No Typology Code available for payment source | Admitting: Orthopedic Surgery

## 2022-04-09 DIAGNOSIS — M25571 Pain in right ankle and joints of right foot: Secondary | ICD-10-CM

## 2022-04-10 ENCOUNTER — Ambulatory Visit: Payer: No Typology Code available for payment source | Admitting: Pharmacist

## 2022-04-12 ENCOUNTER — Other Ambulatory Visit: Payer: Self-pay

## 2022-04-12 ENCOUNTER — Ambulatory Visit (INDEPENDENT_AMBULATORY_CARE_PROVIDER_SITE_OTHER): Payer: No Typology Code available for payment source | Admitting: Pharmacist

## 2022-04-12 DIAGNOSIS — Z23 Encounter for immunization: Secondary | ICD-10-CM

## 2022-04-12 DIAGNOSIS — Z79899 Other long term (current) drug therapy: Secondary | ICD-10-CM | POA: Diagnosis not present

## 2022-04-12 NOTE — Progress Notes (Signed)
Date:  04/12/2022   HPI: Alexander Solomon is a 36 y.o. male who presents to the [facility] clinic for HIV PrEP follow-up.  Insured   [x]    Uninsured  []    Patient Active Problem List   Diagnosis Date Noted   Prediabetes 07/14/2020   Vitamin D insufficiency 07/14/2020   DOE (dyspnea on exertion) 03/14/2020   Acute pain of right knee 11/05/2016   Morbid obesity (HCC) 09/07/2015   GERD (gastroesophageal reflux disease) 09/07/2015   Seasonal allergies 09/07/2015   Attention deficit disorder 09/07/2015   Anxiety 09/07/2015   High risk sexual behavior 09/07/2015   PVC (premature ventricular contraction) 05/04/2015   Benign essential HTN 05/04/2015   Hyperlipidemia 05/04/2015   Obstructive sleep apnea 05/04/2015    Patient's Medications  New Prescriptions   No medications on file  Previous Medications   ACETAMINOPHEN-CODEINE 300-30 MG TABLET    Take 1 tablet by mouth every 12 (twelve) hours as needed. Okay to take NSAIDs with this medication   CYCLOBENZAPRINE (FLEXERIL) 10 MG TABLET    Take 10 mg by mouth 3 (three) times daily as needed for muscle spasms. Reported on 09/07/2015   DICLOFENAC (VOLTAREN) 50 MG EC TABLET    Take 50 mg by mouth 2 (two) times daily as needed.   EMTRICITABINE-TENOFOVIR AF (DESCOVY) 200-25 MG TABLET    Take 1 tablet by mouth daily.   FLUTICASONE (FLONASE) 50 MCG/ACT NASAL SPRAY    Place 2 sprays into both nostrils daily.   LISDEXAMFETAMINE (VYVANSE) 20 MG CAPSULE    Take 60 mg by mouth daily.   LORATADINE (CLARITIN) 10 MG TABLET    Take 10 mg by mouth daily.   LOSARTAN-HYDROCHLOROTHIAZIDE (HYZAAR) 100-25 MG PER TABLET    Take 1 tablet by mouth daily.   METFORMIN (GLUCOPHAGE) 500 MG TABLET    Take 1 tablet (500 mg total) by mouth daily before supper.   MULTIPLE VITAMIN (MULTIVITAMIN) TABLET    Take 1 tablet by mouth daily.   OMEPRAZOLE (PRILOSEC) 40 MG CAPSULE    Take 40 mg by mouth every evening.    SEMAGLUTIDE (RYBELSUS) 3 MG TABS    Take 4 tablets by  mouth daily.   SIMVASTATIN (ZOCOR) 40 MG TABLET    Take 40 mg by mouth daily.   SUCRALFATE (CARAFATE) 1 G TABLET    Take 1 tablet by mouth 3 (three) times daily.   VITAMIN D, ERGOCALCIFEROL, (DRISDOL) 1.25 MG (50000 UNIT) CAPS CAPSULE    Take 1 capsule (50,000 Units total) by mouth every 7 (seven) days.  Modified Medications   No medications on file  Discontinued Medications   No medications on file    Allergies: No Known Allergies  Past Medical History: Past Medical History:  Diagnosis Date   Acute pain of right knee 11/05/2016   ADD (attention deficit disorder)    Allergy    Anxiety    Attention deficit disorder 09/07/2015   Back pain    Benign essential HTN 05/04/2015   Chest pain 05/04/2015   Excessive daytime sleepiness 05/04/2015   GERD (gastroesophageal reflux disease)    High cholesterol    High risk sexual behavior 09/07/2015   Hyperlipidemia 05/04/2015   Hypertension    Joint pain    Morbid obesity (HCC) 09/07/2015   Obesity    Obstructive sleep apnea 05/04/2015   Prediabetes    PVC (premature ventricular contraction) 05/04/2015   Seasonal allergies 09/07/2015   SOB (shortness of breath)  Social History: Social History   Socioeconomic History   Marital status: Divorced    Spouse name: katelin   Number of children: 1   Years of education: college   Highest education level: Not on file  Occupational History   Occupation: Biomedical scientist  Tobacco Use   Smoking status: Never   Smokeless tobacco: Never  Substance and Sexual Activity   Alcohol use: No    Alcohol/week: 0.0 standard drinks of alcohol   Drug use: No   Sexual activity: Not Currently  Other Topics Concern   Not on file  Social History Narrative   Not on file   Social Determinants of Health   Financial Resource Strain: Not on file  Food Insecurity: Not on file  Transportation Needs: Not on file  Physical Activity: Not on file  Stress: Not on file  Social Connections: Not on  file       05/05/2018   11:42 AM 05/05/2018    9:35 AM 02/14/2018    9:14 AM 01/17/2018    9:07 AM  [facility] HIV PREP FLOWSHEET RESULTS  Insurance Status  Insured Insured Insured  How did you hear?   Family Physician Referred from Dr Clelia Croft  Gender at birth  Male Male Male  Gender identity  cis-Male cis-Male cis-Male  Risk for HIV >5 partners in past 6 mos (regardless of condom use);Condomless vaginal or anal intercourse  Condomless vaginal or anal intercourse;>5 partners in past 6 mos (regardless of condom use);Hx of STI Condomless vaginal or anal intercourse  Sex Partners  Men only Men only Men only  # sex partners past 3-6 mos  4-6 4-6 1-3  Sex activity preferences  Insertive and receptive Insertive and receptive;Oral Insertive and receptive;Oral  Condom use  Yes Yes No  % condom use  90 95   Partners genders and ages   22 20-24;M 25-29;M 40-49 M 20-24;M 25-29;M 30-49  Treated for STI?  No Yes N/A  HIV symptoms?  N/A N/A N/A  PrEP Eligibility  CrCl >60 ml/min HIV negative;CrCl >60 ml/min;Substantial risk for HIV Substantial risk for HIV;HIV negative  Paper work received?   No No    Labs:  SCr: Lab Results  Component Value Date   CREATININE 0.77 09/27/2021   CREATININE 0.73 (L) 07/13/2020   CREATININE 0.82 02/14/2018   CREATININE 0.82 01/17/2018   CREATININE 0.83 10/30/2016   HIV Lab Results  Component Value Date   HIV NON-REACTIVE 01/03/2022   HIV NON-REACTIVE 09/27/2021   HIV NON-REACTIVE 06/20/2021   HIV NON-REACTIVE 03/22/2021   HIV NON-REACTIVE 12/13/2020   Hepatitis B Lab Results  Component Value Date   HEPBSAB NON-REACTIVE 06/20/2021   HEPBSAG NEGATIVE 09/07/2015   Hepatitis C Lab Results  Component Value Date   HEPCAB NON-REACTIVE 06/20/2021   Hepatitis A Lab Results  Component Value Date   HAV REACTIVE (A) 01/17/2018   RPR and STI Lab Results  Component Value Date   LABRPR NON-REACTIVE 01/03/2022   LABRPR NON-REACTIVE 06/20/2021   LABRPR NON-REACTIVE  03/22/2021   LABRPR NON-REACTIVE 12/13/2020   LABRPR NON-REACTIVE 05/05/2018    STI Results GC CT  01/03/2022  9:42 AM Negative    Negative    Negative  Negative    Negative    Negative   09/27/2021  8:51 AM Negative    Negative    Negative  Negative    Negative    Negative   06/20/2021  9:14 AM Negative    Negative  Negative  Negative    Negative    Negative   03/22/2021 11:49 AM Negative  Negative   12/13/2020  9:33 AM Negative    Negative    Negative  Negative    Negative    Negative   05/05/2018 12:00 AM Negative    Negative    Negative  Negative    Negative    Negative   02/14/2018 12:00 AM Negative  Negative   01/17/2018 12:00 AM Negative    **POSITIVE**    Negative  Negative    Negative    Negative   10/30/2016 12:00 AM Negative    Negative    Negative  Negative    Negative    Negative   03/08/2016 12:00 AM Negative  Negative   12/06/2015 12:00 AM Negative  Negative     Assessment: Aasir is here for his 3 month PrEP follow up. Doing well on Descovy with no issues or side effects. Completed Hepatitis B vaccine series back in May so will check for immunity today. No issues getting his prescription from [facility] but keeps running out prior to appointments so will schedule him a week early to prevent this from happening. A few new partners since last visit but politely declines STI testing today. Screened for acute HIV symptoms such as fatigue, muscle aches, rash, sore throat, lymphadenopathy, headache, night sweats, nausea/vomiting/diarrhea, and fever. Denies any symptoms.   Updated medication list - he is no longer taking Rybelsus and metformin. He is now taking Mounjaro. States it is working well for him so far. He is still having issues with his ankle and is seeing Dr. Lajoyce Corners at [facility] for this. He missed one dose of Descovy when he was waiting for [facility] to mail it to him. No other issues. Will call/MyChart if he needs testing in between appointments. Will see him  back in October for follow up.    Plan: - HIV antibody + Hepatitis B surf antibody - Descovy x 3 months if HIV negative - F/up with me on 10/9 at 845am  Curvin Hunger L. Jannette Fogo, PharmD, BCIDP, AAHIVP, CPP Clinical Pharmacist Practitioner Infectious Diseases Clinical Pharmacist [facility] 04/12/2022, 8:55 AM

## 2022-04-13 ENCOUNTER — Other Ambulatory Visit: Payer: Self-pay | Admitting: Pharmacist

## 2022-04-13 DIAGNOSIS — Z79899 Other long term (current) drug therapy: Secondary | ICD-10-CM

## 2022-04-13 LAB — HEPATITIS B SURFACE ANTIBODY,QUALITATIVE: Hep B S Ab: REACTIVE — AB

## 2022-04-13 LAB — HIV ANTIBODY (ROUTINE TESTING W REFLEX): HIV 1&2 Ab, 4th Generation: NONREACTIVE

## 2022-04-13 MED ORDER — DESCOVY 200-25 MG PO TABS: 1.0000 | ORAL_TABLET | Freq: Every day | ORAL | 2 refills | Status: DC

## 2022-04-16 ENCOUNTER — Encounter: Payer: Self-pay | Admitting: Pharmacist

## 2022-04-23 ENCOUNTER — Encounter: Payer: Self-pay | Admitting: Orthopedic Surgery

## 2022-04-23 DIAGNOSIS — M25571 Pain in right ankle and joints of right foot: Secondary | ICD-10-CM

## 2022-04-23 MED ORDER — METHYLPREDNISOLONE ACETATE 40 MG/ML IJ SUSP
40.0000 mg | INTRAMUSCULAR | Status: AC | PRN
  Administered 2022-04-23: 40 mg via INTRA_ARTICULAR

## 2022-04-23 MED ORDER — LIDOCAINE HCL 1 % IJ SOLN
2.0000 mL | INTRAMUSCULAR | Status: AC | PRN
  Administered 2022-04-23: 2 mL

## 2022-04-23 NOTE — Progress Notes (Signed)
Office Visit Note   Patient: Alexander Solomon           Date of Birth: 1986/07/31           MRN: [id number] Visit Date: 04/09/2022              Requested by: Lupita Raider, MD [address] [place],  [place] [address] PCP: Lupita Raider, MD  Chief Complaint  Patient presents with   Right Ankle - Follow-up      HPI: Patient is a 36 year old gentleman who is status post right ankle fracture which healed nonoperatively.  Patient is status post MRI scan July 2.  Patient has persistent ankle pain.  Patient's fracture occurred 1 year ago treated closed.  Assessment & Plan: Visit Diagnoses:  1. Pain in right ankle and joints of right foot     Plan: Right ankle was injected he tolerated this well  Follow-Up Instructions: Return in about 4 weeks (around 05/07/2022).   Ortho Exam  Patient is alert, oriented, no adenopathy, well-dressed, normal affect, normal respiratory effort. Reviewed the MRI scan patient has fluid in the sinus Tarsi and osteochondral defect the lateral talar dome posteriorly.  Patient has pain to palpation over the medial joint line no pain to palpation over the sinus Tarsi no pain to palpation over the anterior joint line the posterior tibial tendon and peroneal tendons are nontender to palpation.  Patient does have Achilles tightness with dorsiflexion only to neutral and he has a good dorsalis pedis pulse.  No recent hemoglobin A1c is available.  A1c 2 years ago was 6.3.  Imaging: No results found. No images are attached to the encounter.  Labs: Lab Results  Component Value Date   HGBA1C 6.3 (H) 07/13/2020     Lab Results  Component Value Date   ALBUMIN 4.6 07/13/2020   ALBUMIN 4.2 12/06/2015    No results found for: "MG" Lab Results  Component Value Date   VD25OH 29.7 (L) 07/13/2020    No results found for: "PREALBUMIN"     No data to display           There is no height or weight on file to calculate BMI.  Orders:   Orders Placed This Encounter  Procedures   Medium Joint Inj   No orders of the defined types were placed in this encounter.    Procedures: Medium Joint Inj: R ankle on 04/23/2022 7:40 AM Indications: pain and diagnostic evaluation Details: 22 G 1.5 in needle, anteromedial approach Medications: 2 mL lidocaine 1 %; 40 mg methylPREDNISolone acetate 40 MG/ML Outcome: tolerated well, no immediate complications Procedure, treatment alternatives, risks and benefits explained, specific risks discussed. Consent was given by the patient. Immediately prior to procedure a time out was called to verify the correct patient, procedure, equipment, support staff and site/side marked as required. Patient was prepped and draped in the usual sterile fashion.      Clinical Data: No additional findings.  ROS:  All other systems negative, except as noted in the HPI. Review of Systems  Objective: Vital Signs: There were no vitals taken for this visit.  Specialty Comments:  No specialty comments available.  PMFS History: Patient Active Problem List   Diagnosis Date Noted   Prediabetes 07/14/2020   Vitamin D insufficiency 07/14/2020   DOE (dyspnea on exertion) 03/14/2020   Acute pain of right knee 11/05/2016   Morbid obesity (HCC) 09/07/2015   GERD (gastroesophageal reflux disease) 09/07/2015   Seasonal  allergies 09/07/2015   Attention deficit disorder 09/07/2015   Anxiety 09/07/2015   High risk sexual behavior 09/07/2015   PVC (premature ventricular contraction) 05/04/2015   Benign essential HTN 05/04/2015   Hyperlipidemia 05/04/2015   Obstructive sleep apnea 05/04/2015   Past Medical History:  Diagnosis Date   Acute pain of right knee 11/05/2016   ADD (attention deficit disorder)    Allergy    Anxiety    Attention deficit disorder 09/07/2015   Back pain    Benign essential HTN 05/04/2015   Chest pain 05/04/2015   Excessive daytime sleepiness 05/04/2015   GERD (gastroesophageal  reflux disease)    High cholesterol    High risk sexual behavior 09/07/2015   Hyperlipidemia 05/04/2015   Hypertension    Joint pain    Morbid obesity (HCC) 09/07/2015   Obesity    Obstructive sleep apnea 05/04/2015   Prediabetes    PVC (premature ventricular contraction) 05/04/2015   Seasonal allergies 09/07/2015   SOB (shortness of breath)     Family History  Problem Relation Age of Onset   Hypertension Mother    Diabetes Mother    High Cholesterol Mother    Anxiety disorder Mother    Sleep apnea Mother    Obesity Mother    Heart disease Father    Heart attack Father    High blood pressure Father    High Cholesterol Father     Past Surgical History:  Procedure Laterality Date   KNEE SURGERY Bilateral    WRIST FRACTURE SURGERY     Social History   Occupational History   Occupation: Biomedical scientist  Tobacco Use   Smoking status: Never   Smokeless tobacco: Never  Substance and Sexual Activity   Alcohol use: No    Alcohol/week: 0.0 standard drinks of alcohol   Drug use: No   Sexual activity: Not Currently

## 2022-05-02 ENCOUNTER — Encounter (INDEPENDENT_AMBULATORY_CARE_PROVIDER_SITE_OTHER): Payer: Self-pay

## 2022-05-10 ENCOUNTER — Ambulatory Visit (INDEPENDENT_AMBULATORY_CARE_PROVIDER_SITE_OTHER): Payer: No Typology Code available for payment source | Admitting: Orthopedic Surgery

## 2022-05-10 DIAGNOSIS — M25571 Pain in right ankle and joints of right foot: Secondary | ICD-10-CM | POA: Diagnosis not present

## 2022-06-11 ENCOUNTER — Encounter: Payer: Self-pay | Admitting: Orthopedic Surgery

## 2022-06-11 NOTE — Progress Notes (Signed)
Office Visit Note   Patient: Alexander Solomon           Date of Birth: Feb 25, 1986           MRN: [id number] Visit Date: 05/10/2022              Requested by: Mayra Neer, MD [address] [place],  [place] [address] PCP: Mayra Neer, MD  Chief Complaint  Patient presents with   Right Ankle - Follow-up      HPI: Patient is a 36 year old gentleman who presents in follow-up for right ankle pain he is status post a steroid injection 4 weeks ago.  He states that it did help.  He states he still has some ankle stiffness.  Patient was able to do a 3 mile walk last night and states his ankle feels sore today.  Assessment & Plan: Visit Diagnoses:  1. Pain in right ankle and joints of right foot     Plan: Recommend Achilles stretching and toe raises for fascial strengthening.  Follow-Up Instructions: Return if symptoms worsen or fail to improve.   Ortho Exam  Patient is alert, oriented, no adenopathy, well-dressed, normal affect, normal respiratory effort. Examination patient has good pulses.  He has no pain with passive range of motion of the ankle or subtalar joint there is no redness or cellulitis.  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  Imaging: No results found. No images are attached to the encounter.  Labs: Lab Results  Component Value Date   HGBA1C 6.3 (H) 07/13/2020     Lab Results  Component Value Date   ALBUMIN 4.6 07/13/2020   ALBUMIN 4.2 12/06/2015    No results found for: "MG" Lab Results  Component Value Date   VD25OH 29.7 (L) 07/13/2020    No results found for: "PREALBUMIN"     No data to display           There is no height or weight on file to calculate BMI.  Orders:  No orders of the defined types were placed in this encounter.  No orders of the defined types were placed in this encounter.    Procedures: No procedures performed  Clinical Data: No additional  findings.  ROS:  All other systems negative, except as noted in the HPI. Review of Systems  Objective: Vital Signs: There were no vitals taken for this visit.  Specialty Comments:  No specialty comments available.  PMFS History: Patient Active Problem List   Diagnosis Date Noted   Prediabetes 07/14/2020   Vitamin D insufficiency 07/14/2020   DOE (dyspnea on exertion) 03/14/2020   Acute pain of right knee 11/05/2016   Morbid obesity ([place]) 09/07/2015   GERD (gastroesophageal reflux disease) 09/07/2015   Seasonal allergies 09/07/2015   Attention deficit disorder 09/07/2015   Anxiety 09/07/2015   High risk sexual behavior 09/07/2015   PVC (premature ventricular contraction) 05/04/2015   Benign essential HTN 05/04/2015   Hyperlipidemia 05/04/2015   Obstructive sleep apnea 05/04/2015   Past Medical History:  Diagnosis Date   Acute pain of right knee 11/05/2016   ADD (attention deficit disorder)    Allergy    Anxiety    Attention deficit disorder 09/07/2015   Back pain    Benign essential HTN 05/04/2015   Chest pain 05/04/2015   Excessive daytime sleepiness 05/04/2015   GERD (gastroesophageal reflux disease)    High cholesterol    High risk sexual behavior 09/07/2015  Hyperlipidemia 05/04/2015   Hypertension    Joint pain    Morbid obesity ([facility]) 09/07/2015   Obesity    Obstructive sleep apnea 05/04/2015   Prediabetes    PVC (premature ventricular contraction) 05/04/2015   Seasonal allergies 09/07/2015   SOB (shortness of breath)     Family History  Problem Relation Age of Onset   Hypertension Mother    Diabetes Mother    High Cholesterol Mother    Anxiety disorder Mother    Sleep apnea Mother    Obesity Mother    Heart disease Father    Heart attack Father    High blood pressure Father    High Cholesterol Father     Past Surgical History:  Procedure Laterality Date   KNEE SURGERY Bilateral    WRIST FRACTURE SURGERY     Social History   Occupational  History   Occupation: Biomedical scientist  Tobacco Use   Smoking status: Never   Smokeless tobacco: Never  Substance and Sexual Activity   Alcohol use: No    Alcohol/week: 0.0 standard drinks of alcohol   Drug use: No   Sexual activity: Not Currently

## 2022-07-02 ENCOUNTER — Telehealth: Payer: Self-pay

## 2022-07-02 ENCOUNTER — Other Ambulatory Visit: Payer: Self-pay | Admitting: Pharmacist

## 2022-07-02 ENCOUNTER — Ambulatory Visit (INDEPENDENT_AMBULATORY_CARE_PROVIDER_SITE_OTHER): Payer: No Typology Code available for payment source | Admitting: Pharmacist

## 2022-07-02 ENCOUNTER — Other Ambulatory Visit (HOSPITAL_COMMUNITY)
Admission: RE | Admit: 2022-07-02 | Discharge: 2022-07-02 | Disposition: A | Discharge status: Home / Self Care | Payer: No Typology Code available for payment source | Source: Ambulatory Visit | Attending: Infectious Disease | Admitting: Infectious Disease

## 2022-07-02 ENCOUNTER — Other Ambulatory Visit: Payer: Self-pay

## 2022-07-02 ENCOUNTER — Other Ambulatory Visit (HOSPITAL_COMMUNITY): Payer: Self-pay

## 2022-07-02 DIAGNOSIS — Z113 Encounter for screening for infections with a predominantly sexual mode of transmission: Secondary | ICD-10-CM

## 2022-07-02 DIAGNOSIS — Z79899 Other long term (current) drug therapy: Secondary | ICD-10-CM | POA: Diagnosis not present

## 2022-07-02 MED ORDER — DESCOVY 200-25 MG PO TABS: 1.0000 | ORAL_TABLET | Freq: Every day | ORAL | 0 refills | Status: AC

## 2022-07-02 NOTE — Progress Notes (Signed)
Date:  07/02/2022   HPI: Alexander Solomon is a 36 y.o. male who presents to the [facility] for HIV PrEP follow-up.  Insured   [x]    Uninsured  []    Patient Active Problem List   Diagnosis Date Noted   Prediabetes 07/14/2020   Vitamin D insufficiency 07/14/2020   DOE (dyspnea on exertion) 03/14/2020   Acute pain of right knee 11/05/2016   Morbid obesity ([place]) 09/07/2015   GERD (gastroesophageal reflux disease) 09/07/2015   Seasonal allergies 09/07/2015   Attention deficit disorder 09/07/2015   Anxiety 09/07/2015   High risk sexual behavior 09/07/2015   PVC (premature ventricular contraction) 05/04/2015   Benign essential HTN 05/04/2015   Hyperlipidemia 05/04/2015   Obstructive sleep apnea 05/04/2015    Patient's Medications  New Prescriptions   No medications on file  Previous Medications   ACETAMINOPHEN-CODEINE 300-30 MG TABLET    Take 1 tablet by mouth every 12 (twelve) hours as needed. Okay to take NSAIDs with this medication   CYCLOBENZAPRINE (FLEXERIL) 10 MG TABLET    Take 10 mg by mouth 3 (three) times daily as needed for muscle spasms. Reported on 09/07/2015   DICLOFENAC (VOLTAREN) 50 MG EC TABLET    Take 50 mg by mouth 2 (two) times daily as needed.   EMTRICITABINE-TENOFOVIR AF (DESCOVY) 200-25 MG TABLET    Take 1 tablet by mouth daily.   FLUTICASONE (FLONASE) 50 MCG/ACT NASAL SPRAY    Place 2 sprays into both nostrils daily.   LISDEXAMFETAMINE (VYVANSE) 20 MG CAPSULE    Take 60 mg by mouth daily.   LORATADINE (CLARITIN) 10 MG TABLET    Take 10 mg by mouth daily.   LOSARTAN-HYDROCHLOROTHIAZIDE (HYZAAR) 100-25 MG PER TABLET    Take 1 tablet by mouth daily.   METFORMIN (GLUCOPHAGE) 500 MG TABLET    Take 1 tablet (500 mg total) by mouth daily before supper.   MULTIPLE VITAMIN (MULTIVITAMIN) TABLET    Take 1 tablet by mouth daily.   OMEPRAZOLE (PRILOSEC) 40 MG CAPSULE    Take 40 mg by mouth every evening.    SEMAGLUTIDE (RYBELSUS) 3 MG TABS    Take 4 tablets by  mouth daily.   SIMVASTATIN (ZOCOR) 40 MG TABLET    Take 40 mg by mouth daily.   SUCRALFATE (CARAFATE) 1 G TABLET    Take 1 tablet by mouth 3 (three) times daily.   VITAMIN D, ERGOCALCIFEROL, (DRISDOL) 1.25 MG (50000 UNIT) CAPS CAPSULE    Take 1 capsule (50,000 Units total) by mouth every 7 (seven) days.  Modified Medications   No medications on file  Discontinued Medications   No medications on file    Allergies: No Known Allergies  Past Medical History: Past Medical History:  Diagnosis Date   Acute pain of right knee 11/05/2016   ADD (attention deficit disorder)    Allergy    Anxiety    Attention deficit disorder 09/07/2015   Back pain    Benign essential HTN 05/04/2015   Chest pain 05/04/2015   Excessive daytime sleepiness 05/04/2015   GERD (gastroesophageal reflux disease)    High cholesterol    High risk sexual behavior 09/07/2015   Hyperlipidemia 05/04/2015   Hypertension    Joint pain    Morbid obesity ([place]) 09/07/2015   Obesity    Obstructive sleep apnea 05/04/2015   Prediabetes    PVC (premature ventricular contraction) 05/04/2015   Seasonal allergies 09/07/2015   SOB (shortness of breath)  Social History: Social History   Socioeconomic History   Marital status: Divorced    Spouse name: katelin   Number of children: 1   Years of education: college   Highest education level: Not on file  Occupational History   Occupation: Biomedical scientist  Tobacco Use   Smoking status: Never   Smokeless tobacco: Never  Substance and Sexual Activity   Alcohol use: No    Alcohol/week: 0.0 standard drinks of alcohol   Drug use: No   Sexual activity: Not Currently  Other Topics Concern   Not on file  Social History Narrative   Not on file   Social Determinants of Health   Financial Resource Strain: Not on file  Food Insecurity: Not on file  Transportation Needs: Not on file  Physical Activity: Not on file  Stress: Not on file  Social Connections: Not on  file       05/05/2018   11:42 AM 05/05/2018    9:35 AM 02/14/2018    9:14 AM 01/17/2018    9:07 AM  CHL HIV PREP FLOWSHEET RESULTS  Insurance Status  Insured Insured Insured  How did you hear?   Family Physician Referred from Dr Clelia Croft  Gender at birth  Male Male Male  Gender identity  cis-Male cis-Male cis-Male  Risk for HIV >5 partners in past 6 mos (regardless of condom use);Condomless vaginal or anal intercourse  Condomless vaginal or anal intercourse;>5 partners in past 6 mos (regardless of condom use);Hx of STI Condomless vaginal or anal intercourse  Sex Partners  Men only Men only Men only  # sex partners past 3-6 mos  4-6 4-6 1-3  Sex activity preferences  Insertive and receptive Insertive and receptive;Oral Insertive and receptive;Oral  Condom use  Yes Yes No  % condom use  90 95   Partners genders and ages   40 20-24;M 25-29;M 15-49 M 20-24;M 25-29;M 30-49  Treated for STI?  No Yes N/A  HIV symptoms?  N/A N/A N/A  PrEP Eligibility  CrCl >60 ml/min HIV negative;CrCl >60 ml/min;Substantial risk for HIV Substantial risk for HIV;HIV negative  Paper work received?   No No    Labs:  SCr: Lab Results  Component Value Date   CREATININE 0.77 09/27/2021   CREATININE 0.73 (L) 07/13/2020   CREATININE 0.82 02/14/2018   CREATININE 0.82 01/17/2018   CREATININE 0.83 10/30/2016   HIV Lab Results  Component Value Date   HIV NON-REACTIVE 04/12/2022   HIV NON-REACTIVE 01/03/2022   HIV NON-REACTIVE 09/27/2021   HIV NON-REACTIVE 06/20/2021   HIV NON-REACTIVE 03/22/2021   Hepatitis B Lab Results  Component Value Date   HEPBSAB REACTIVE (A) 04/12/2022   HEPBSAG NEGATIVE 09/07/2015   Hepatitis C Lab Results  Component Value Date   HEPCAB NON-REACTIVE 06/20/2021   Hepatitis A Lab Results  Component Value Date   HAV REACTIVE (A) 01/17/2018   RPR and STI Lab Results  Component Value Date   LABRPR NON-REACTIVE 01/03/2022   LABRPR NON-REACTIVE 06/20/2021   LABRPR NON-REACTIVE  03/22/2021   LABRPR NON-REACTIVE 12/13/2020   LABRPR NON-REACTIVE 05/05/2018    STI Results GC CT  01/03/2022  9:42 AM Negative    Negative    Negative  Negative    Negative    Negative   09/27/2021  8:51 AM Negative    Negative    Negative  Negative    Negative    Negative   06/20/2021  9:14 AM Negative    Negative  Negative  Negative    Negative    Negative   03/22/2021 11:49 AM Negative  Negative   12/13/2020  9:33 AM Negative    Negative    Negative  Negative    Negative    Negative   05/05/2018 12:00 AM Negative    Negative    Negative  Negative    Negative    Negative   02/14/2018 12:00 AM Negative  Negative   01/17/2018 12:00 AM Negative    **POSITIVE**    Negative  Negative    Negative    Negative   10/30/2016 12:00 AM Negative    Negative    Negative  Negative    Negative    Negative   03/08/2016 12:00 AM Negative  Negative   12/06/2015 12:00 AM Negative  Negative     Assessment: Alexander Solomon presents for 3 month PrEP follow up. He is tolerating Descovy without issues or side effects. Informed him that his Hepatitis B surface antibody obtained last visit in July is reactive and he is now immune. He last received STI screening in April which was negative. He agrees to STI screening today. He also shares that his employer prescription insurance has changed. Discussed this with Lupita Leash. He will require a PA for Descovy. He politely declines his annual flu vaccine today and is also not interested in receiving any additional vaccines for COVID-19. He confirms that he is still following with Dr. Lajoyce Corners for his ankle pain.   Plan: - Obtain HIV antibody - Obtain urine, rectal, oral cytologies and RPR  - Submitting prior authorization for Descovy  - Provided Descovy samples to cover patient while PA gets approved  - Refill Descovy x 3 months if HIV negative  - Follow-up on 10/01/2022  Larena Sox, PharmD PGY1 Pharmacy Resident   07/02/2022  9:04 AM

## 2022-07-02 NOTE — Progress Notes (Signed)
Medication Samples have been provided to the patient.  Drug name: Descovy        Strength: 200/25 mg       Qty: 7 tablets (1 pack)   LOT: [id number]   Exp.Date: 11/2023  Dosing instructions: Take one tablet by mouth once daily  The patient has been instructed regarding the correct time, dose, and frequency of taking this medication, including desired effects and most common side effects.   Edyn Popoca L. Eber Hong, PharmD, BCIDP, AAHIVP, CPP Clinical Pharmacist Practitioner Infectious Diseases [facility] 09/05/2020, 10:07 AM

## 2022-07-02 NOTE — Telephone Encounter (Signed)
[facility] Patient Advocate Encounter   Received notification from [employer] that prior authorization for Descovy is required.   PA submitted on 07/02/22 Faxed chart notes and labs to [phone] Telephone # [phone] Status is pending    [facility] will continue to follow.   Ileene Patrick, [facility] Patient [facility] for Infectious Disease Phone: [phone] Fax:  [phone]

## 2022-07-03 ENCOUNTER — Encounter: Payer: Self-pay | Admitting: Pharmacist

## 2022-07-03 ENCOUNTER — Telehealth: Payer: Self-pay

## 2022-07-03 ENCOUNTER — Other Ambulatory Visit: Payer: Self-pay

## 2022-07-03 ENCOUNTER — Other Ambulatory Visit (HOSPITAL_COMMUNITY): Payer: Self-pay

## 2022-07-03 DIAGNOSIS — Z79899 Other long term (current) drug therapy: Secondary | ICD-10-CM

## 2022-07-03 LAB — CYTOLOGY, (ORAL, ANAL, URETHRAL) ANCILLARY ONLY
Chlamydia: NEGATIVE
Chlamydia: NEGATIVE
Comment: NEGATIVE
Comment: NEGATIVE
Comment: NORMAL
Comment: NORMAL
Neisseria Gonorrhea: NEGATIVE
Neisseria Gonorrhea: NEGATIVE

## 2022-07-03 LAB — URINE CYTOLOGY ANCILLARY ONLY
Chlamydia: NEGATIVE
Comment: NEGATIVE
Comment: NORMAL
Neisseria Gonorrhea: NEGATIVE

## 2022-07-03 LAB — RPR: RPR Ser Ql: NONREACTIVE

## 2022-07-03 LAB — HIV ANTIBODY (ROUTINE TESTING W REFLEX): HIV 1&2 Ab, 4th Generation: NONREACTIVE

## 2022-07-03 MED ORDER — EMTRICITABINE-TENOFOVIR DF 200-300 MG PO TABS
1.0000 | ORAL_TABLET | Freq: Every day | ORAL | 2 refills | Status: DC
  Filled 2022-07-03 – 2022-07-04 (×2): qty 30, 30d supply, fill #0
  Filled 2022-08-08: qty 30, 30d supply, fill #1
  Filled 2022-09-03: qty 30, 30d supply, fill #2

## 2022-07-03 NOTE — Telephone Encounter (Signed)
[facility] Patient Advocate Encounter  Received notification from [employer] that the request for prior authorization for Descovy has been denied due to drug is not on the formulary.       This encounter will continue to be updated until final determination.    Ileene Patrick, [facility] Patient [facility] for Infectious Disease Phone: [phone] Fax:  [phone]

## 2022-07-04 ENCOUNTER — Other Ambulatory Visit (HOSPITAL_COMMUNITY): Payer: Self-pay

## 2022-07-24 ENCOUNTER — Other Ambulatory Visit (HOSPITAL_COMMUNITY): Payer: Self-pay

## 2022-07-25 ENCOUNTER — Other Ambulatory Visit (HOSPITAL_COMMUNITY): Payer: Self-pay

## 2022-08-06 ENCOUNTER — Other Ambulatory Visit (HOSPITAL_COMMUNITY): Payer: Self-pay

## 2022-08-08 ENCOUNTER — Other Ambulatory Visit (HOSPITAL_COMMUNITY): Payer: Self-pay

## 2022-08-09 ENCOUNTER — Other Ambulatory Visit (HOSPITAL_COMMUNITY): Payer: Self-pay

## 2022-09-03 ENCOUNTER — Other Ambulatory Visit (HOSPITAL_BASED_OUTPATIENT_CLINIC_OR_DEPARTMENT_OTHER): Payer: Self-pay

## 2022-09-03 ENCOUNTER — Other Ambulatory Visit (HOSPITAL_COMMUNITY): Payer: Self-pay

## 2022-09-03 MED ORDER — AMPHETAMINE SULFATE 10 MG PO TABS
10.0000 mg | ORAL_TABLET | Freq: Two times a day (BID) | ORAL | 0 refills | Status: DC
  Filled 2022-09-03: qty 60, 30d supply, fill #0

## 2022-09-03 MED ORDER — SILDENAFIL CITRATE 50 MG PO TABS
50.0000 mg | ORAL_TABLET | Freq: Every day | ORAL | 1 refills | Status: AC
  Filled 2022-09-03: qty 30, 30d supply, fill #0

## 2022-09-04 ENCOUNTER — Other Ambulatory Visit: Payer: Self-pay

## 2022-09-04 ENCOUNTER — Other Ambulatory Visit: Payer: Self-pay | Admitting: Family Medicine

## 2022-09-04 DIAGNOSIS — R221 Localized swelling, mass and lump, neck: Secondary | ICD-10-CM

## 2022-09-20 ENCOUNTER — Other Ambulatory Visit: Payer: No Typology Code available for payment source

## 2022-10-01 ENCOUNTER — Other Ambulatory Visit (HOSPITAL_COMMUNITY): Payer: Self-pay

## 2022-10-01 ENCOUNTER — Ambulatory Visit: Payer: No Typology Code available for payment source | Admitting: Pharmacist

## 2022-10-03 ENCOUNTER — Other Ambulatory Visit: Payer: No Typology Code available for payment source

## 2022-10-08 ENCOUNTER — Other Ambulatory Visit: Payer: Self-pay

## 2022-10-08 ENCOUNTER — Other Ambulatory Visit (HOSPITAL_COMMUNITY): Payer: Self-pay

## 2022-10-08 ENCOUNTER — Ambulatory Visit (INDEPENDENT_AMBULATORY_CARE_PROVIDER_SITE_OTHER): Payer: No Typology Code available for payment source | Admitting: Pharmacist

## 2022-10-08 ENCOUNTER — Other Ambulatory Visit (HOSPITAL_COMMUNITY)
Admission: RE | Admit: 2022-10-08 | Discharge: 2022-10-08 | Disposition: A | Discharge status: Home / Self Care | Payer: No Typology Code available for payment source | Source: Ambulatory Visit | Attending: Infectious Disease | Admitting: Infectious Disease

## 2022-10-08 ENCOUNTER — Other Ambulatory Visit (HOSPITAL_BASED_OUTPATIENT_CLINIC_OR_DEPARTMENT_OTHER): Payer: Self-pay

## 2022-10-08 DIAGNOSIS — Z113 Encounter for screening for infections with a predominantly sexual mode of transmission: Secondary | ICD-10-CM | POA: Insufficient documentation

## 2022-10-08 DIAGNOSIS — Z79899 Other long term (current) drug therapy: Secondary | ICD-10-CM | POA: Insufficient documentation

## 2022-10-08 DIAGNOSIS — Z2981 Encounter for HIV pre-exposure prophylaxis: Secondary | ICD-10-CM | POA: Diagnosis not present

## 2022-10-08 MED ORDER — EMTRICITABINE-TENOFOVIR DF 200-300 MG PO TABS
1.0000 | ORAL_TABLET | Freq: Every day | ORAL | 2 refills | Status: DC
  Filled 2022-10-08 (×3): qty 30, 30d supply, fill #0
  Filled 2022-11-08: qty 30, 30d supply, fill #1
  Filled 2022-12-13 (×2): qty 30, 30d supply, fill #2

## 2022-10-08 NOTE — Progress Notes (Signed)
Date:  10/08/2022   HPI: Alexander Solomon is a 37 y.o. male who presents to the [facility] for HIV PrEP follow-up.  Insured   [x]    Uninsured  []    Patient Active Problem List   Diagnosis Date Noted   Prediabetes 07/14/2020   Vitamin D insufficiency 07/14/2020   DOE (dyspnea on exertion) 03/14/2020   Acute pain of right knee 11/05/2016   Morbid obesity ([place]) 09/07/2015   GERD (gastroesophageal reflux disease) 09/07/2015   Seasonal allergies 09/07/2015   Attention deficit disorder 09/07/2015   Anxiety 09/07/2015   High risk sexual behavior 09/07/2015   PVC (premature ventricular contraction) 05/04/2015   Benign essential HTN 05/04/2015   Hyperlipidemia 05/04/2015   Obstructive sleep apnea 05/04/2015    Patient's Medications  New Prescriptions   No medications on file  Previous Medications   ACETAMINOPHEN-CODEINE 300-30 MG TABLET    Take 1 tablet by mouth every 12 (twelve) hours as needed. Okay to take NSAIDs with this medication   AMPHETAMINE SULFATE 10 MG TABS    Take 1 tablet (10 mg) by mouth 2 (two) times daily for ADD.   CYCLOBENZAPRINE (FLEXERIL) 10 MG TABLET    Take 10 mg by mouth 3 (three) times daily as needed for muscle spasms. Reported on 09/07/2015   DICLOFENAC (VOLTAREN) 50 MG EC TABLET    Take 50 mg by mouth 2 (two) times daily as needed.   EMTRICITABINE-TENOFOVIR (TRUVADA) 200-300 MG TABLET    Take 1 tablet by mouth daily.   FLUTICASONE (FLONASE) 50 MCG/ACT NASAL SPRAY    Place 2 sprays into both nostrils daily.   LISDEXAMFETAMINE (VYVANSE) 20 MG CAPSULE    Take 60 mg by mouth daily.   LORATADINE (CLARITIN) 10 MG TABLET    Take 10 mg by mouth daily.   LOSARTAN-HYDROCHLOROTHIAZIDE (HYZAAR) 100-25 MG PER TABLET    Take 1 tablet by mouth daily.   METFORMIN (GLUCOPHAGE) 500 MG TABLET    Take 1 tablet (500 mg total) by mouth daily before supper.   MULTIPLE VITAMIN (MULTIVITAMIN) TABLET    Take 1 tablet by mouth daily.   OMEPRAZOLE (PRILOSEC) 40 MG CAPSULE     Take 40 mg by mouth every evening.    SEMAGLUTIDE (RYBELSUS) 3 MG TABS    Take 4 tablets by mouth daily.   SILDENAFIL (VIAGRA) 50 MG TABLET    Take 1 tablet by mouth once daily as needed 30 minutes prior to sexual activity.   SIMVASTATIN (ZOCOR) 40 MG TABLET    Take 40 mg by mouth daily.   SUCRALFATE (CARAFATE) 1 G TABLET    Take 1 tablet by mouth 3 (three) times daily.   VITAMIN D, ERGOCALCIFEROL, (DRISDOL) 1.25 MG (50000 UNIT) CAPS CAPSULE    Take 1 capsule (50,000 Units total) by mouth every 7 (seven) days.  Modified Medications   No medications on file  Discontinued Medications   No medications on file    Allergies: No Known Allergies  Past Medical History: Past Medical History:  Diagnosis Date   Acute pain of right knee 11/05/2016   ADD (attention deficit disorder)    Allergy    Anxiety    Attention deficit disorder 09/07/2015   Back pain    Benign essential HTN 05/04/2015   Chest pain 05/04/2015   Excessive daytime sleepiness 05/04/2015   GERD (gastroesophageal reflux disease)    High cholesterol    High risk sexual behavior 09/07/2015   Hyperlipidemia 05/04/2015   Hypertension  Joint pain    Morbid obesity ([place]) 09/07/2015   Obesity    Obstructive sleep apnea 05/04/2015   Prediabetes    PVC (premature ventricular contraction) 05/04/2015   Seasonal allergies 09/07/2015   SOB (shortness of breath)     Social History: Social History   Socioeconomic History   Marital status: Divorced    Spouse name: katelin   Number of children: 1   Years of education: college   Highest education level: Not on file  Occupational History   Occupation: Chief Executive Officer  Tobacco Use   Smoking status: Never   Smokeless tobacco: Never  Substance and Sexual Activity   Alcohol use: No    Alcohol/week: 0.0 standard drinks of alcohol   Drug use: No   Sexual activity: Not Currently  Other Topics Concern   Not on file  Social History Narrative   Not on file   Social  Determinants of Health   Financial Resource Strain: Not on file  Food Insecurity: Not on file  Transportation Needs: Not on file  Physical Activity: Not on file  Stress: Not on file  Social Connections: Not on file       05/05/2018   11:42 AM 05/05/2018    9:35 AM 02/14/2018    9:14 AM 01/17/2018    9:07 AM  CHL HIV PREP FLOWSHEET RESULTS  Insurance Status  Insured Insured Insured  How did you hear?   Family Physician Referred from Dr Brigitte Pulse  Gender at birth  Male Male Male  Gender identity  cis-Male cis-Male cis-Male  Risk for HIV >5 partners in past 6 mos (regardless of condom use);Condomless vaginal or anal intercourse  Condomless vaginal or anal intercourse;>5 partners in past 6 mos (regardless of condom use);Hx of STI Condomless vaginal or anal intercourse  Sex Partners  Men only Men only Men only  # sex partners past 3-6 mos  4-6 4-6 1-3  Sex activity preferences  Insertive and receptive Insertive and receptive;Oral Insertive and receptive;Oral  Condom use  Yes Yes No  % condom use  6 95   Partners genders and ages   M 20-24;M 25-29;M 72-49 M 20-24;M 25-29;M 30-49  Treated for STI?  No Yes N/A  HIV symptoms?  N/A N/A N/A  PrEP Eligibility  CrCl >60 ml/min HIV negative;CrCl >60 ml/min;Substantial risk for HIV Substantial risk for HIV;HIV negative  Paper work received?   No No    Labs:  SCr: Lab Results  Component Value Date   CREATININE 0.77 09/27/2021   CREATININE 0.73 (L) 07/13/2020   CREATININE 0.82 02/14/2018   CREATININE 0.82 01/17/2018   CREATININE 0.83 10/30/2016   HIV Lab Results  Component Value Date   HIV NON-REACTIVE 07/02/2022   HIV NON-REACTIVE 04/12/2022   HIV NON-REACTIVE 01/03/2022   HIV NON-REACTIVE 09/27/2021   HIV NON-REACTIVE 06/20/2021   Hepatitis B Lab Results  Component Value Date   HEPBSAB REACTIVE (A) 04/12/2022   HEPBSAG NEGATIVE 09/07/2015   Hepatitis C Lab Results  Component Value Date   HEPCAB NON-REACTIVE 06/20/2021    Hepatitis A Lab Results  Component Value Date   HAV REACTIVE (A) 01/17/2018   RPR and STI Lab Results  Component Value Date   LABRPR NON-REACTIVE 07/02/2022   LABRPR NON-REACTIVE 01/03/2022   LABRPR NON-REACTIVE 06/20/2021   LABRPR NON-REACTIVE 03/22/2021   LABRPR NON-REACTIVE 12/13/2020    STI Results GC CT  07/02/2022  8:54 AM Negative    Negative    Negative  Negative  Negative    Negative   01/03/2022  9:42 AM Negative    Negative    Negative  Negative    Negative    Negative   09/27/2021  8:51 AM Negative    Negative    Negative  Negative    Negative    Negative   06/20/2021  9:14 AM Negative    Negative    Negative  Negative    Negative    Negative   03/22/2021 11:49 AM Negative  Negative   12/13/2020  9:33 AM Negative    Negative    Negative  Negative    Negative    Negative   05/05/2018 12:00 AM Negative    Negative    Negative  Negative    Negative    Negative   02/14/2018 12:00 AM Negative  Negative   01/17/2018 12:00 AM Negative    **POSITIVE**    Negative  Negative    Negative    Negative   10/30/2016 12:00 AM Negative    Negative    Negative  Negative    Negative    Negative   03/08/2016 12:00 AM Negative  Negative   12/06/2015 12:00 AM Negative  Negative     Assessment: Earlin is here today for his 3 month PrEP follow up appointment. He was previously taking Descovy but got new insurance at the end of last year and the Descovy prior authorization was denied. He started Truvada in October and has had no issues with the switch. He was previously on Truvada before taking Descovy years ago. Screened for acute HIV symptoms such as fatigue, muscle aches, rash, sore throat, lymphadenopathy, headache, night sweats, nausea/vomiting/diarrhea, and fever. Denies any symptoms. Agrees to three site testing today. Will check labs and refill his Truvada. Will see him back in 3 months.   Plan: - HIV antibody, RPR, urine/rectal/pharyngeal GC/CT swabs  for cytology today - Truvada x 3 months if HIV negative - F/u with me again in April   Analeia Ismael L. Jannette Fogo, PharmD, BCIDP, AAHIVP, CPP Clinical Pharmacist Practitioner Infectious Diseases Clinical Pharmacist [facility] 10/08/2022, 8:55 AM

## 2022-10-09 LAB — URINE CYTOLOGY ANCILLARY ONLY
Chlamydia: NEGATIVE
Comment: NEGATIVE
Comment: NORMAL
Neisseria Gonorrhea: NEGATIVE

## 2022-10-09 LAB — CYTOLOGY, (ORAL, ANAL, URETHRAL) ANCILLARY ONLY
Chlamydia: NEGATIVE
Chlamydia: NEGATIVE
Comment: NEGATIVE
Comment: NEGATIVE
Comment: NORMAL
Comment: NORMAL
Neisseria Gonorrhea: NEGATIVE
Neisseria Gonorrhea: NEGATIVE

## 2022-10-09 LAB — RPR: RPR Ser Ql: NONREACTIVE

## 2022-10-09 LAB — HIV ANTIBODY (ROUTINE TESTING W REFLEX): HIV 1&2 Ab, 4th Generation: NONREACTIVE

## 2022-10-17 ENCOUNTER — Ambulatory Visit
Admission: RE | Admit: 2022-10-17 | Discharge: 2022-10-17 | Disposition: A | Discharge status: Home / Self Care | Payer: No Typology Code available for payment source | Source: Ambulatory Visit | Attending: Family Medicine | Admitting: Family Medicine

## 2022-10-17 DIAGNOSIS — R221 Localized swelling, mass and lump, neck: Secondary | ICD-10-CM

## 2022-10-19 ENCOUNTER — Other Ambulatory Visit (HOSPITAL_BASED_OUTPATIENT_CLINIC_OR_DEPARTMENT_OTHER): Payer: Self-pay

## 2022-10-19 MED ORDER — AMPHETAMINE SULFATE 10 MG PO TABS
ORAL_TABLET | ORAL | 0 refills | Status: DC
  Filled 2022-10-19: qty 90, 30d supply, fill #0

## 2022-10-20 ENCOUNTER — Other Ambulatory Visit (HOSPITAL_BASED_OUTPATIENT_CLINIC_OR_DEPARTMENT_OTHER): Payer: Self-pay

## 2022-10-22 ENCOUNTER — Other Ambulatory Visit (HOSPITAL_BASED_OUTPATIENT_CLINIC_OR_DEPARTMENT_OTHER): Payer: Self-pay

## 2022-10-26 ENCOUNTER — Other Ambulatory Visit: Payer: Self-pay | Admitting: Family Medicine

## 2022-10-26 DIAGNOSIS — R221 Localized swelling, mass and lump, neck: Secondary | ICD-10-CM

## 2022-10-31 ENCOUNTER — Other Ambulatory Visit (HOSPITAL_COMMUNITY): Payer: Self-pay

## 2022-11-02 ENCOUNTER — Other Ambulatory Visit (HOSPITAL_COMMUNITY): Payer: Self-pay

## 2022-11-05 ENCOUNTER — Other Ambulatory Visit (HOSPITAL_COMMUNITY): Payer: Self-pay

## 2022-11-06 ENCOUNTER — Ambulatory Visit
Admission: RE | Admit: 2022-11-06 | Discharge: 2022-11-06 | Disposition: A | Discharge status: Home / Self Care | Payer: No Typology Code available for payment source | Source: Ambulatory Visit | Attending: Family Medicine | Admitting: Family Medicine

## 2022-11-06 DIAGNOSIS — R221 Localized swelling, mass and lump, neck: Secondary | ICD-10-CM

## 2022-11-06 MED ORDER — IOPAMIDOL (ISOVUE-300) INJECTION 61%
75.0000 mL | Freq: Once | INTRAVENOUS | Status: AC | PRN
  Administered 2022-11-06: 75 mL via INTRAVENOUS

## 2022-11-08 ENCOUNTER — Other Ambulatory Visit (HOSPITAL_COMMUNITY): Payer: Self-pay

## 2022-11-09 ENCOUNTER — Other Ambulatory Visit (HOSPITAL_COMMUNITY): Payer: Self-pay

## 2022-11-29 ENCOUNTER — Other Ambulatory Visit (HOSPITAL_COMMUNITY): Payer: Self-pay

## 2022-11-29 ENCOUNTER — Other Ambulatory Visit (HOSPITAL_BASED_OUTPATIENT_CLINIC_OR_DEPARTMENT_OTHER): Payer: Self-pay

## 2022-11-29 MED ORDER — AMPHETAMINE SULFATE 10 MG PO TABS
ORAL_TABLET | ORAL | 0 refills | Status: DC
  Filled 2022-11-29: qty 90, 30d supply, fill #0

## 2022-11-30 ENCOUNTER — Other Ambulatory Visit (HOSPITAL_BASED_OUTPATIENT_CLINIC_OR_DEPARTMENT_OTHER): Payer: Self-pay

## 2022-12-03 ENCOUNTER — Other Ambulatory Visit (HOSPITAL_COMMUNITY): Payer: Self-pay

## 2022-12-05 ENCOUNTER — Other Ambulatory Visit (HOSPITAL_COMMUNITY): Payer: Self-pay

## 2022-12-10 ENCOUNTER — Other Ambulatory Visit: Payer: Self-pay

## 2022-12-13 ENCOUNTER — Other Ambulatory Visit: Payer: Self-pay

## 2022-12-13 ENCOUNTER — Other Ambulatory Visit (HOSPITAL_COMMUNITY): Payer: Self-pay

## 2023-01-01 ENCOUNTER — Other Ambulatory Visit (HOSPITAL_COMMUNITY): Payer: Self-pay

## 2023-01-02 ENCOUNTER — Other Ambulatory Visit (HOSPITAL_COMMUNITY)
Admission: RE | Admit: 2023-01-02 | Discharge: 2023-01-02 | Disposition: A | Discharge status: Home / Self Care | Payer: No Typology Code available for payment source | Source: Ambulatory Visit | Attending: Infectious Disease | Admitting: Infectious Disease

## 2023-01-02 ENCOUNTER — Other Ambulatory Visit: Payer: Self-pay

## 2023-01-02 ENCOUNTER — Ambulatory Visit (INDEPENDENT_AMBULATORY_CARE_PROVIDER_SITE_OTHER): Payer: No Typology Code available for payment source | Admitting: Pharmacist

## 2023-01-02 DIAGNOSIS — Z113 Encounter for screening for infections with a predominantly sexual mode of transmission: Secondary | ICD-10-CM | POA: Diagnosis present

## 2023-01-02 DIAGNOSIS — Z23 Encounter for immunization: Secondary | ICD-10-CM | POA: Diagnosis not present

## 2023-01-02 DIAGNOSIS — Z79899 Other long term (current) drug therapy: Secondary | ICD-10-CM

## 2023-01-02 NOTE — Progress Notes (Signed)
Date:  01/02/2023   HPI: Alexander Solomon is a 37 y.o. male who presents to the [facility] pharmacy clinic for HIV PrEP follow-up.  Insured   [x]    Uninsured  []    Patient Active Problem List   Diagnosis Date Noted   Prediabetes 07/14/2020   Vitamin D insufficiency 07/14/2020   DOE (dyspnea on exertion) 03/14/2020   Acute pain of right knee 11/05/2016   Morbid obesity 09/07/2015   GERD (gastroesophageal reflux disease) 09/07/2015   Seasonal allergies 09/07/2015   Attention deficit disorder 09/07/2015   Anxiety 09/07/2015   High risk sexual behavior 09/07/2015   PVC (premature ventricular contraction) 05/04/2015   Benign essential HTN 05/04/2015   Hyperlipidemia 05/04/2015   Obstructive sleep apnea 05/04/2015    Patient's Medications  New Prescriptions   No medications on file  Previous Medications   ACETAMINOPHEN-CODEINE 300-30 MG TABLET    Take 1 tablet by mouth every 12 (twelve) hours as needed. Okay to take NSAIDs with this medication   AMPHETAMINE SULFATE 10 MG TABS    Take 2 tablets in the morning and may redose another tablet in the midafternoon as needed as directed   CYCLOBENZAPRINE (FLEXERIL) 10 MG TABLET    Take 10 mg by mouth 3 (three) times daily as needed for muscle spasms. Reported on 09/07/2015   DICLOFENAC (VOLTAREN) 50 MG EC TABLET    Take 50 mg by mouth 2 (two) times daily as needed.   EMTRICITABINE-TENOFOVIR (TRUVADA) 200-300 MG TABLET    Take 1 tablet by mouth daily.   FLUTICASONE (FLONASE) 50 MCG/ACT NASAL SPRAY    Place 2 sprays into both nostrils daily.   LISDEXAMFETAMINE (VYVANSE) 20 MG CAPSULE    Take 60 mg by mouth daily.   LORATADINE (CLARITIN) 10 MG TABLET    Take 10 mg by mouth daily.   LOSARTAN-HYDROCHLOROTHIAZIDE (HYZAAR) 100-25 MG PER TABLET    Take 1 tablet by mouth daily.   METFORMIN (GLUCOPHAGE) 500 MG TABLET    Take 1 tablet (500 mg total) by mouth daily before supper.   MULTIPLE VITAMIN (MULTIVITAMIN) TABLET    Take 1 tablet by mouth daily.    OMEPRAZOLE (PRILOSEC) 40 MG CAPSULE    Take 40 mg by mouth every evening.    SEMAGLUTIDE (RYBELSUS) 3 MG TABS    Take 4 tablets by mouth daily.   SILDENAFIL (VIAGRA) 50 MG TABLET    Take 1 tablet by mouth once daily as needed 30 minutes prior to sexual activity.   SIMVASTATIN (ZOCOR) 40 MG TABLET    Take 40 mg by mouth daily.   SUCRALFATE (CARAFATE) 1 G TABLET    Take 1 tablet by mouth 3 (three) times daily.   VITAMIN D, ERGOCALCIFEROL, (DRISDOL) 1.25 MG (50000 UNIT) CAPS CAPSULE    Take 1 capsule (50,000 Units total) by mouth every 7 (seven) days.  Modified Medications   No medications on file  Discontinued Medications   No medications on file    Allergies: No Known Allergies  Past Medical History: Past Medical History:  Diagnosis Date   Acute pain of right knee 11/05/2016   ADD (attention deficit disorder)    Allergy    Anxiety    Attention deficit disorder 09/07/2015   Back pain    Benign essential HTN 05/04/2015   Chest pain 05/04/2015   Excessive daytime sleepiness 05/04/2015   GERD (gastroesophageal reflux disease)    High cholesterol    High risk sexual behavior 09/07/2015   Hyperlipidemia 05/04/2015  Hypertension    Joint pain    Morbid obesity (HCC) 09/07/2015   Obesity    Obstructive sleep apnea 05/04/2015   Prediabetes    PVC (premature ventricular contraction) 05/04/2015   Seasonal allergies 09/07/2015   SOB (shortness of breath)     Social History: Social History   Socioeconomic History   Marital status: Divorced    Spouse name: katelin   Number of children: 1   Years of education: college   Highest education level: Not on file  Occupational History   Occupation: Biomedical scientist  Tobacco Use   Smoking status: Never   Smokeless tobacco: Never  Substance and Sexual Activity   Alcohol use: No    Alcohol/week: 0.0 standard drinks of alcohol   Drug use: No   Sexual activity: Not Currently  Other Topics Concern   Not on file  Social History  Narrative   Not on file   Social Determinants of Health   Financial Resource Strain: Not on file  Food Insecurity: Not on file  Transportation Needs: Not on file  Physical Activity: Not on file  Stress: Not on file  Social Connections: Not on file       05/05/2018   11:42 AM 05/05/2018    9:35 AM 02/14/2018    9:14 AM 01/17/2018    9:07 AM  [facility] HIV PREP FLOWSHEET RESULTS  Insurance Status  Insured Insured Insured  How did you hear?   Family Physician Referred from Dr Clelia Croft  Gender at birth  Male Male Male  Gender identity  cis-Male cis-Male cis-Male  Risk for HIV >5 partners in past 6 mos (regardless of condom use);Condomless vaginal or anal intercourse  Condomless vaginal or anal intercourse;>5 partners in past 6 mos (regardless of condom use);Hx of STI Condomless vaginal or anal intercourse  Sex Partners  Men only Men only Men only  # sex partners past 3-6 mos  4-6 4-6 1-3  Sex activity preferences  Insertive and receptive Insertive and receptive;Oral Insertive and receptive;Oral  Condom use  Yes Yes No  % condom use  90 95   Partners genders and ages   59 20-24;M 25-29;M 20-49 M 20-24;M 25-29;M 30-49  Treated for STI?  No Yes N/A  HIV symptoms?  N/A N/A N/A  PrEP Eligibility  CrCl >60 ml/min HIV negative;CrCl >60 ml/min;Substantial risk for HIV Substantial risk for HIV;HIV negative  Paper work received?   No No    Labs:  SCr: Lab Results  Component Value Date   CREATININE 0.77 09/27/2021   CREATININE 0.73 (L) 07/13/2020   CREATININE 0.82 02/14/2018   CREATININE 0.82 01/17/2018   CREATININE 0.83 10/30/2016   HIV Lab Results  Component Value Date   HIV NON-REACTIVE 10/08/2022   HIV NON-REACTIVE 07/02/2022   HIV NON-REACTIVE 04/12/2022   HIV NON-REACTIVE 01/03/2022   HIV NON-REACTIVE 09/27/2021   Hepatitis B Lab Results  Component Value Date   HEPBSAB REACTIVE (A) 04/12/2022   HEPBSAG NEGATIVE 09/07/2015   Hepatitis C Lab Results  Component Value Date    HEPCAB NON-REACTIVE 06/20/2021   Hepatitis A Lab Results  Component Value Date   HAV REACTIVE (A) 01/17/2018   RPR and STI Lab Results  Component Value Date   LABRPR NON-REACTIVE 10/08/2022   LABRPR NON-REACTIVE 07/02/2022   LABRPR NON-REACTIVE 01/03/2022   LABRPR NON-REACTIVE 06/20/2021   LABRPR NON-REACTIVE 03/22/2021    STI Results GC CT  10/08/2022  9:12 AM Negative    Negative  Negative  Negative    Negative    Negative   07/02/2022  8:54 AM Negative    Negative    Negative  Negative    Negative    Negative   01/03/2022  9:42 AM Negative    Negative    Negative  Negative    Negative    Negative   09/27/2021  8:51 AM Negative    Negative    Negative  Negative    Negative    Negative   06/20/2021  9:14 AM Negative    Negative    Negative  Negative    Negative    Negative   03/22/2021 11:49 AM Negative  Negative   12/13/2020  9:33 AM Negative    Negative    Negative  Negative    Negative    Negative   05/05/2018 12:00 AM Negative    Negative    Negative  Negative    Negative    Negative   02/14/2018 12:00 AM Negative  Negative   01/17/2018 12:00 AM Negative    **POSITIVE**    Negative  Negative    Negative    Negative   10/30/2016 12:00 AM Negative    Negative    Negative  Negative    Negative    Negative   03/08/2016 12:00 AM Negative  Negative   12/06/2015 12:00 AM Negative  Negative     Assessment: [name] is here today for his 3 month PrEP follow up appointment. He has been on Truvada since October of 2023 but was on Descovy prior to that. He is tolerating Truvada well.    Screened for acute HIV symptoms such as fatigue, muscle aches, rash, sore throat, lymphadenopathy, headache, night sweats, nausea/vomiting/diarrhea, and fever. Denies any symptoms.   He agrees to three site testing today and RPR. Will check labs and refill his Truvada. Will see him back in 3 months.   [name] is eligible for the COVID vaccine, HPV series, Tdap and his  second mpox vaccine. He is willing to start the HPV series. He declines the second mpox vaccine due to feeling poorly after the first one. He declines the COVID vaccine as he wishes to build natural immunity. He states that he had a Tdap vaccine last year at the [facility] upstairs. We have updated this in our records.   Plan: - First HPV vaccine administered today. Follow up to complete series over the next several months.  - Check HIV antibody, RPR, urine/rectal/pharyngeal GC/CT swabs for cytology today - Truvada x 3 months if HIV negative - Follow up with Cassie in July   Blane OharaHannah K. Harvard Zeiss, PharmD  PGY1 Pharmacy Resident

## 2023-01-03 LAB — CYTOLOGY, (ORAL, ANAL, URETHRAL) ANCILLARY ONLY
Chlamydia: NEGATIVE
Chlamydia: NEGATIVE
Comment: NEGATIVE
Comment: NEGATIVE
Comment: NORMAL
Comment: NORMAL
Neisseria Gonorrhea: NEGATIVE
Neisseria Gonorrhea: NEGATIVE

## 2023-01-03 LAB — RPR: RPR Ser Ql: NONREACTIVE

## 2023-01-03 LAB — URINE CYTOLOGY ANCILLARY ONLY
Chlamydia: NEGATIVE
Comment: NEGATIVE
Comment: NORMAL
Neisseria Gonorrhea: NEGATIVE

## 2023-01-03 LAB — HIV ANTIBODY (ROUTINE TESTING W REFLEX): HIV 1&2 Ab, 4th Generation: NONREACTIVE

## 2023-01-04 ENCOUNTER — Other Ambulatory Visit (HOSPITAL_COMMUNITY): Payer: Self-pay

## 2023-01-04 ENCOUNTER — Other Ambulatory Visit: Payer: Self-pay

## 2023-01-04 DIAGNOSIS — Z79899 Other long term (current) drug therapy: Secondary | ICD-10-CM

## 2023-01-04 MED ORDER — EMTRICITABINE-TENOFOVIR DF 200-300 MG PO TABS
1.0000 | ORAL_TABLET | Freq: Every day | ORAL | 2 refills | Status: DC
  Filled 2023-01-04 – 2023-01-08 (×2): qty 30, 30d supply, fill #0
  Filled 2023-02-04: qty 30, 30d supply, fill #1
  Filled 2023-03-07 (×2): qty 30, 30d supply, fill #2

## 2023-01-07 ENCOUNTER — Other Ambulatory Visit (HOSPITAL_COMMUNITY): Payer: Self-pay

## 2023-01-08 ENCOUNTER — Other Ambulatory Visit (HOSPITAL_COMMUNITY): Payer: Self-pay

## 2023-01-09 ENCOUNTER — Other Ambulatory Visit (HOSPITAL_BASED_OUTPATIENT_CLINIC_OR_DEPARTMENT_OTHER): Payer: Self-pay

## 2023-01-09 ENCOUNTER — Other Ambulatory Visit: Payer: Self-pay

## 2023-01-09 MED ORDER — AMPHETAMINE SULFATE 10 MG PO TABS
20.0000 mg | ORAL_TABLET | Freq: Every morning | ORAL | 0 refills | Status: AC
  Filled 2023-01-09: qty 90, 30d supply, fill #0

## 2023-01-10 ENCOUNTER — Other Ambulatory Visit (HOSPITAL_BASED_OUTPATIENT_CLINIC_OR_DEPARTMENT_OTHER): Payer: Self-pay

## 2023-01-31 ENCOUNTER — Other Ambulatory Visit (HOSPITAL_COMMUNITY): Payer: Self-pay

## 2023-02-04 ENCOUNTER — Other Ambulatory Visit (HOSPITAL_COMMUNITY): Payer: Self-pay

## 2023-02-05 ENCOUNTER — Other Ambulatory Visit (HOSPITAL_COMMUNITY): Payer: Self-pay

## 2023-02-07 ENCOUNTER — Other Ambulatory Visit (HOSPITAL_BASED_OUTPATIENT_CLINIC_OR_DEPARTMENT_OTHER): Payer: Self-pay

## 2023-02-07 MED ORDER — AMPHETAMINE SULFATE 10 MG PO TABS
ORAL_TABLET | ORAL | 0 refills | Status: DC
  Filled 2023-02-07: qty 90, 30d supply, fill #0

## 2023-03-07 ENCOUNTER — Other Ambulatory Visit (HOSPITAL_COMMUNITY): Payer: Self-pay

## 2023-03-08 ENCOUNTER — Other Ambulatory Visit (HOSPITAL_COMMUNITY): Payer: Self-pay

## 2023-04-02 ENCOUNTER — Other Ambulatory Visit (HOSPITAL_COMMUNITY): Payer: Self-pay

## 2023-04-03 ENCOUNTER — Other Ambulatory Visit (HOSPITAL_COMMUNITY): Payer: Self-pay

## 2023-04-03 ENCOUNTER — Other Ambulatory Visit: Payer: Self-pay

## 2023-04-03 ENCOUNTER — Ambulatory Visit (INDEPENDENT_AMBULATORY_CARE_PROVIDER_SITE_OTHER): Payer: No Typology Code available for payment source | Admitting: Pharmacist

## 2023-04-03 ENCOUNTER — Other Ambulatory Visit (HOSPITAL_COMMUNITY)
Admission: RE | Admit: 2023-04-03 | Discharge: 2023-04-03 | Disposition: A | Discharge status: Home / Self Care | Payer: No Typology Code available for payment source | Source: Ambulatory Visit | Attending: Infectious Disease | Admitting: Infectious Disease

## 2023-04-03 DIAGNOSIS — Z23 Encounter for immunization: Secondary | ICD-10-CM | POA: Diagnosis not present

## 2023-04-03 DIAGNOSIS — Z113 Encounter for screening for infections with a predominantly sexual mode of transmission: Secondary | ICD-10-CM | POA: Diagnosis not present

## 2023-04-03 DIAGNOSIS — Z2981 Encounter for HIV pre-exposure prophylaxis: Secondary | ICD-10-CM

## 2023-04-03 DIAGNOSIS — Z79899 Other long term (current) drug therapy: Secondary | ICD-10-CM | POA: Insufficient documentation

## 2023-04-03 MED ORDER — EMTRICITABINE-TENOFOVIR DF 200-300 MG PO TABS
1.0000 | ORAL_TABLET | Freq: Every day | ORAL | 0 refills | Status: DC
  Filled 2023-04-03: qty 30, 30d supply, fill #0
  Filled 2023-04-03: qty 90, 90d supply, fill #0
  Filled 2023-05-06: qty 30, 30d supply, fill #1
  Filled 2023-06-07: qty 30, 30d supply, fill #2

## 2023-04-03 NOTE — Progress Notes (Signed)
Date:  04/03/2023   HPI: Alexander Solomon is a 37 y.o. male who presents to the [facility] pharmacy clinic for HIV PrEP follow-up.  Insured   [x]    Uninsured  []    Patient Active Problem List   Diagnosis Date Noted   Prediabetes 07/14/2020   Vitamin D insufficiency 07/14/2020   DOE (dyspnea on exertion) 03/14/2020   Acute pain of right knee 11/05/2016   Morbid obesity (HCC) 09/07/2015   GERD (gastroesophageal reflux disease) 09/07/2015   Seasonal allergies 09/07/2015   Attention deficit disorder 09/07/2015   Anxiety 09/07/2015   High risk sexual behavior 09/07/2015   PVC (premature ventricular contraction) 05/04/2015   Benign essential HTN 05/04/2015   Hyperlipidemia 05/04/2015   Obstructive sleep apnea 05/04/2015    Patient's Medications  New Prescriptions   No medications on file  Previous Medications   ACETAMINOPHEN-CODEINE 300-30 MG TABLET    Take 1 tablet by mouth every 12 (twelve) hours as needed. Okay to take NSAIDs with this medication   AMPHETAMINE SULFATE 10 MG TABS    Take 2 tablets (20 mg total) by mouth in the morning and may take another tablet in the midafternoon as needed as directed.   AMPHETAMINE SULFATE 10 MG TABS    Take 2 tablets (20 mg total) by mouth every morning. May also take 1 tablet (10 mg total) midafternoon as needed as directed   CYCLOBENZAPRINE (FLEXERIL) 10 MG TABLET    Take 10 mg by mouth 3 (three) times daily as needed for muscle spasms. Reported on 09/07/2015   DICLOFENAC (VOLTAREN) 50 MG EC TABLET    Take 50 mg by mouth 2 (two) times daily as needed.   EMTRICITABINE-TENOFOVIR (TRUVADA) 200-300 MG TABLET    Take 1 tablet by mouth daily.   FLUTICASONE (FLONASE) 50 MCG/ACT NASAL SPRAY    Place 2 sprays into both nostrils daily.   LISDEXAMFETAMINE (VYVANSE) 20 MG CAPSULE    Take 60 mg by mouth daily.   LORATADINE (CLARITIN) 10 MG TABLET    Take 10 mg by mouth daily.   LOSARTAN-HYDROCHLOROTHIAZIDE (HYZAAR) 100-25 MG PER TABLET    Take 1 tablet by mouth  daily.   METFORMIN (GLUCOPHAGE) 500 MG TABLET    Take 1 tablet (500 mg total) by mouth daily before supper.   MULTIPLE VITAMIN (MULTIVITAMIN) TABLET    Take 1 tablet by mouth daily.   OMEPRAZOLE (PRILOSEC) 40 MG CAPSULE    Take 40 mg by mouth every evening.    SEMAGLUTIDE (RYBELSUS) 3 MG TABS    Take 4 tablets by mouth daily.   SILDENAFIL (VIAGRA) 50 MG TABLET    Take 1 tablet by mouth once daily as needed 30 minutes prior to sexual activity.   SIMVASTATIN (ZOCOR) 40 MG TABLET    Take 40 mg by mouth daily.   SUCRALFATE (CARAFATE) 1 G TABLET    Take 1 tablet by mouth 3 (three) times daily.   VITAMIN D, ERGOCALCIFEROL, (DRISDOL) 1.25 MG (50000 UNIT) CAPS CAPSULE    Take 1 capsule (50,000 Units total) by mouth every 7 (seven) days.  Modified Medications   No medications on file  Discontinued Medications   No medications on file       05/05/2018   11:42 AM 05/05/2018    9:35 AM 02/14/2018    9:14 AM 01/17/2018    9:07 AM  [facility] HIV PREP FLOWSHEET RESULTS  Insurance Status  Insured Insured Insured  How did you hear?   Family Physician Referred  from Dr Clelia Croft  Gender at birth  Male Male Male  Gender identity  cis-Male cis-Male cis-Male  Risk for HIV >5 partners in past 6 mos (regardless of condom use);Condomless vaginal or anal intercourse  Condomless vaginal or anal intercourse;>5 partners in past 6 mos (regardless of condom use);Hx of STI Condomless vaginal or anal intercourse  Sex Partners  Men only Men only Men only  # sex partners past 3-6 mos  4-6 4-6 1-3  Sex activity preferences  Insertive and receptive Insertive and receptive;Oral Insertive and receptive;Oral  Condom use  Yes Yes No  % condom use  90 95   Partners genders and ages   39 20-24;M 25-29;M 41-49 M 20-24;M 25-29;M 30-49  Treated for STI?  No Yes N/A  HIV symptoms?  N/A N/A N/A  PrEP Eligibility  CrCl >60 ml/min HIV negative;CrCl >60 ml/min;Substantial risk for HIV Substantial risk for HIV;HIV negative  Paper work received?    No No    Labs:  SCr: Lab Results  Component Value Date   CREATININE 0.77 09/27/2021   CREATININE 0.73 (L) 07/13/2020   CREATININE 0.82 02/14/2018   CREATININE 0.82 01/17/2018   CREATININE 0.83 10/30/2016   HIV Lab Results  Component Value Date   HIV NON-REACTIVE 01/02/2023   HIV NON-REACTIVE 10/08/2022   HIV NON-REACTIVE 07/02/2022   HIV NON-REACTIVE 04/12/2022   HIV NON-REACTIVE 01/03/2022   Hepatitis B Lab Results  Component Value Date   HEPBSAB REACTIVE (A) 04/12/2022   HEPBSAG NEGATIVE 09/07/2015   Hepatitis C Lab Results  Component Value Date   HEPCAB NON-REACTIVE 06/20/2021   Hepatitis A Lab Results  Component Value Date   HAV REACTIVE (A) 01/17/2018   RPR and STI Lab Results  Component Value Date   LABRPR NON-REACTIVE 01/02/2023   LABRPR NON-REACTIVE 10/08/2022   LABRPR NON-REACTIVE 07/02/2022   LABRPR NON-REACTIVE 01/03/2022   LABRPR NON-REACTIVE 06/20/2021    STI Results GC CT  01/02/2023  9:00 AM Negative    Negative    Negative  Negative    Negative    Negative   10/08/2022  9:12 AM Negative    Negative    Negative  Negative    Negative    Negative   07/02/2022  8:54 AM Negative    Negative    Negative  Negative    Negative    Negative   01/03/2022  9:42 AM Negative    Negative    Negative  Negative    Negative    Negative   09/27/2021  8:51 AM Negative    Negative    Negative  Negative    Negative    Negative   06/20/2021  9:14 AM Negative    Negative    Negative  Negative    Negative    Negative   03/22/2021 11:49 AM Negative  Negative   12/13/2020  9:33 AM Negative    Negative    Negative  Negative    Negative    Negative   05/05/2018 12:00 AM Negative    Negative    Negative  Negative    Negative    Negative   02/14/2018 12:00 AM Negative  Negative   01/17/2018 12:00 AM Negative    **POSITIVE**    Negative  Negative    Negative    Negative   10/30/2016 12:00 AM Negative    Negative    Negative  Negative     Negative    Negative   03/08/2016 12:00  AM Negative  Negative   12/06/2015 12:00 AM Negative  Negative     Assessment: Alexander Solomon presents today for HIV PrEP follow up. He is doing well on Truvada with no issues or concerns. He has lost ~30 lbs since the beginning of the year and is in the process of adjusting his blood pressure medications with his PCP as he is having some fatigue. Screened for acute HIV symptoms such as fatigue, muscle aches, rash, sore throat, lymphadenopathy, headache, night sweats, nausea/vomiting/diarrhea, and fever. Denies any symptoms. No exposures or symptoms of STIs and agrees to full STI testing today with RPR and oral/urine/rectal cytologies.   He is almost out of medication so will go ahead and refill today as I am going out of town until 7/16. His insurance will fill 90 days at one time so will write Rx for #90. He is due for his 2nd HPV vaccine today. Third one due in at least 12 weeks so will administer at his next appointment in October (October 2nd is 12 week mark). All questions answered.   Plan: - HIV antibody, RPR, urine/rectal/pharyngeal GC/CT swabs for cytology today  - HPV vaccine #2/3 - next due in October - Truvada x 90 days if HIV negative - F/u with me again on 07/02/23  Bao Coreas L. Kailand Seda, PharmD, BCIDP, AAHIVP, CPP Clinical Pharmacist Practitioner Infectious Diseases Clinical Pharmacist [facility] 04/03/2023, 8:41 AM

## 2023-04-04 LAB — CYTOLOGY, (ORAL, ANAL, URETHRAL) ANCILLARY ONLY
Chlamydia: NEGATIVE
Chlamydia: NEGATIVE
Comment: NEGATIVE
Comment: NEGATIVE
Comment: NORMAL
Comment: NORMAL
Neisseria Gonorrhea: NEGATIVE
Neisseria Gonorrhea: NEGATIVE

## 2023-04-04 LAB — URINE CYTOLOGY ANCILLARY ONLY
Chlamydia: NEGATIVE
Comment: NEGATIVE
Comment: NORMAL
Neisseria Gonorrhea: NEGATIVE

## 2023-04-04 LAB — RPR: RPR Ser Ql: NONREACTIVE

## 2023-04-04 LAB — HIV ANTIBODY (ROUTINE TESTING W REFLEX): HIV 1&2 Ab, 4th Generation: NONREACTIVE

## 2023-04-05 ENCOUNTER — Other Ambulatory Visit (HOSPITAL_COMMUNITY): Payer: Self-pay

## 2023-04-10 ENCOUNTER — Other Ambulatory Visit (HOSPITAL_BASED_OUTPATIENT_CLINIC_OR_DEPARTMENT_OTHER): Payer: Self-pay

## 2023-04-10 MED ORDER — AMPHETAMINE SULFATE 10 MG PO TABS
20.0000 mg | ORAL_TABLET | Freq: Every morning | ORAL | 0 refills | Status: DC
  Filled 2023-04-10: qty 90, 30d supply, fill #0

## 2023-04-11 ENCOUNTER — Other Ambulatory Visit: Payer: Self-pay

## 2023-04-12 ENCOUNTER — Other Ambulatory Visit (HOSPITAL_BASED_OUTPATIENT_CLINIC_OR_DEPARTMENT_OTHER): Payer: Self-pay

## 2023-04-30 ENCOUNTER — Other Ambulatory Visit (HOSPITAL_COMMUNITY): Payer: Self-pay

## 2023-05-06 ENCOUNTER — Encounter (HOSPITAL_COMMUNITY): Payer: Self-pay

## 2023-05-06 ENCOUNTER — Other Ambulatory Visit (HOSPITAL_COMMUNITY): Payer: Self-pay

## 2023-05-07 ENCOUNTER — Other Ambulatory Visit: Payer: Self-pay

## 2023-05-21 ENCOUNTER — Other Ambulatory Visit (HOSPITAL_BASED_OUTPATIENT_CLINIC_OR_DEPARTMENT_OTHER): Payer: Self-pay

## 2023-05-21 MED ORDER — CARVEDILOL 3.125 MG PO TABS
3.1250 mg | ORAL_TABLET | Freq: Two times a day (BID) | ORAL | 1 refills | Status: DC
  Filled 2023-05-21: qty 60, 30d supply, fill #0
  Filled 2023-06-15: qty 60, 30d supply, fill #1

## 2023-05-29 ENCOUNTER — Other Ambulatory Visit (HOSPITAL_BASED_OUTPATIENT_CLINIC_OR_DEPARTMENT_OTHER): Payer: Self-pay

## 2023-05-29 MED ORDER — AMPHETAMINE SULFATE 10 MG PO TABS
ORAL_TABLET | ORAL | 0 refills | Status: DC
  Filled 2023-05-29: qty 90, 30d supply, fill #0

## 2023-06-03 ENCOUNTER — Other Ambulatory Visit (HOSPITAL_COMMUNITY): Payer: Self-pay

## 2023-06-05 ENCOUNTER — Other Ambulatory Visit (HOSPITAL_COMMUNITY): Payer: Self-pay

## 2023-06-07 ENCOUNTER — Other Ambulatory Visit (HOSPITAL_COMMUNITY): Payer: Self-pay

## 2023-06-15 ENCOUNTER — Other Ambulatory Visit (HOSPITAL_BASED_OUTPATIENT_CLINIC_OR_DEPARTMENT_OTHER): Payer: Self-pay

## 2023-06-18 ENCOUNTER — Encounter: Payer: Self-pay | Admitting: Pharmacist

## 2023-06-21 ENCOUNTER — Encounter: Payer: Self-pay | Admitting: Pharmacist

## 2023-06-26 ENCOUNTER — Other Ambulatory Visit: Payer: Self-pay

## 2023-07-01 ENCOUNTER — Other Ambulatory Visit: Payer: Self-pay

## 2023-07-02 ENCOUNTER — Other Ambulatory Visit: Payer: Self-pay

## 2023-07-02 ENCOUNTER — Ambulatory Visit: Payer: No Typology Code available for payment source | Admitting: Pharmacist

## 2023-07-08 NOTE — Progress Notes (Unsigned)
HPI: Alexander Solomon is a 37 y.o. male who presents to the [facility] pharmacy clinic for HIV PrEP follow-up.  Insured   [x]    Uninsured  []    Patient Active Problem List   Diagnosis Date Noted   On pre-exposure prophylaxis for HIV 04/03/2023   Prediabetes 07/14/2020   Vitamin D insufficiency 07/14/2020   DOE (dyspnea on exertion) 03/14/2020   Acute pain of right knee 11/05/2016   Morbid obesity (HCC) 09/07/2015   GERD (gastroesophageal reflux disease) 09/07/2015   Seasonal allergies 09/07/2015   Attention deficit disorder 09/07/2015   Anxiety 09/07/2015   PVC (premature ventricular contraction) 05/04/2015   Benign essential HTN 05/04/2015   Hyperlipidemia 05/04/2015   Obstructive sleep apnea 05/04/2015    Patient's Medications  New Prescriptions   No medications on file  Previous Medications   ACETAMINOPHEN-CODEINE 300-30 MG TABLET    Take 1 tablet by mouth every 12 (twelve) hours as needed. Okay to take NSAIDs with this medication   AMPHETAMINE SULFATE 10 MG TABS    Take 2 tablets (20 mg total) by mouth in the morning and may take another tablet in the midafternoon as needed as directed.   AMPHETAMINE SULFATE 10 MG TABS    Take 2 tablets (20 mg total) by mouth every morning. May also take 1 tablet (10 mg total) midafternoon as needed as directed   AMPHETAMINE SULFATE 10 MG TABS    Take 2 tablets (20 mg total) by mouth every morning and may redose another tablet in the midafternoon as needed   AMPHETAMINE SULFATE 10 MG TABS    Take 2 tablets (20 mg total) by mouth in the morning AND may 1 tablet (10 mg total) daily in the midafternoon as neeeded as directed   CARVEDILOL (COREG) 3.125 MG TABLET    Take 1 tablet (3.125 mg total) by mouth 2 (two) times daily with food.   CYCLOBENZAPRINE (FLEXERIL) 10 MG TABLET    Take 10 mg by mouth 3 (three) times daily as needed for muscle spasms. Reported on 09/07/2015   DICLOFENAC (VOLTAREN) 50 MG EC TABLET    Take 50 mg by mouth 2 (two) times daily as  needed.   EMTRICITABINE-TENOFOVIR (TRUVADA) 200-300 MG TABLET    Take 1 tablet by mouth daily.   FLUTICASONE (FLONASE) 50 MCG/ACT NASAL SPRAY    Place 2 sprays into both nostrils daily.   LISDEXAMFETAMINE (VYVANSE) 20 MG CAPSULE    Take 60 mg by mouth daily.   LORATADINE (CLARITIN) 10 MG TABLET    Take 10 mg by mouth daily.   LOSARTAN-HYDROCHLOROTHIAZIDE (HYZAAR) 100-25 MG PER TABLET    Take 1 tablet by mouth daily.   METFORMIN (GLUCOPHAGE) 500 MG TABLET    Take 1 tablet (500 mg total) by mouth daily before supper.   MULTIPLE VITAMIN (MULTIVITAMIN) TABLET    Take 1 tablet by mouth daily.   OMEPRAZOLE (PRILOSEC) 40 MG CAPSULE    Take 40 mg by mouth every evening.    SEMAGLUTIDE (RYBELSUS) 3 MG TABS    Take 4 tablets by mouth daily.   SILDENAFIL (VIAGRA) 50 MG TABLET    Take 1 tablet by mouth once daily as needed 30 minutes prior to sexual activity.   SIMVASTATIN (ZOCOR) 40 MG TABLET    Take 40 mg by mouth daily.   SUCRALFATE (CARAFATE) 1 G TABLET    Take 1 tablet by mouth 3 (three) times daily.   VITAMIN D, ERGOCALCIFEROL, (DRISDOL) 1.25 MG (50000 UNIT) CAPS  CAPSULE    Take 1 capsule (50,000 Units total) by mouth every 7 (seven) days.  Modified Medications   No medications on file  Discontinued Medications   No medications on file       05/05/2018   11:42 AM 05/05/2018    9:35 AM 02/14/2018    9:14 AM 01/17/2018    9:07 AM  CH[facility] HIV PREP FLOWSHEET RESULTS  Insurance Status  Insured Insured Insured  How did you hear?   Family Physician Referred from Dr Clelia Croft  Gender at birth  Male Male Male  Gender identity  cis-Male cis-Male cis-Male  Risk for HIV >5 partners in past 6 mos (regardless of condom use);Condomless vaginal or anal intercourse  Condomless vaginal or anal intercourse;>5 partners in past 6 mos (regardless of condom use);Hx of STI Condomless vaginal or anal intercourse  Sex Partners  Men only Men only Men only  # sex partners past 3-6 mos  4-6 4-6 1-3  Sex activity preferences   Insertive and receptive Insertive and receptive;Oral Insertive and receptive;Oral  Condom use  Yes Yes No  % condom use  90 95   Partners genders and ages   29 20-24;M 25-29;M 50-49 M 20-24;M 25-29;M 30-49  Treated for STI?  No Yes N/A  HIV symptoms?  N/A N/A N/A  PrEP Eligibility  CrCl >60 ml/min HIV negative;CrCl >60 ml/min;Substantial risk for HIV Substantial risk for HIV;HIV negative  Paper work received?   No No    Labs:  SCr: Lab Results  Component Value Date   CREATININE 0.77 09/27/2021   CREATININE 0.73 (L) 07/13/2020   CREATININE 0.82 02/14/2018   CREATININE 0.82 01/17/2018   CREATININE 0.83 10/30/2016   HIV Lab Results  Component Value Date   HIV NON-REACTIVE 04/03/2023   HIV NON-REACTIVE 01/02/2023   HIV NON-REACTIVE 10/08/2022   HIV NON-REACTIVE 07/02/2022   HIV NON-REACTIVE 04/12/2022   Hepatitis B Lab Results  Component Value Date   HEPBSAB REACTIVE (A) 04/12/2022   HEPBSAG NEGATIVE 09/07/2015   Hepatitis C Lab Results  Component Value Date   HEPCAB NON-REACTIVE 06/20/2021   Hepatitis A Lab Results  Component Value Date   HAV REACTIVE (A) 01/17/2018   RPR and STI Lab Results  Component Value Date   LABRPR NON-REACTIVE 04/03/2023   LABRPR NON-REACTIVE 01/02/2023   LABRPR NON-REACTIVE 10/08/2022   LABRPR NON-REACTIVE 07/02/2022   LABRPR NON-REACTIVE 01/03/2022    STI Results GC CT  04/03/2023  8:46 AM Negative    Negative    Negative  Negative    Negative    Negative   01/02/2023  9:00 AM Negative    Negative    Negative  Negative    Negative    Negative   10/08/2022  9:12 AM Negative    Negative    Negative  Negative    Negative    Negative   07/02/2022  8:54 AM Negative    Negative    Negative  Negative    Negative    Negative   01/03/2022  9:42 AM Negative    Negative    Negative  Negative    Negative    Negative   09/27/2021  8:51 AM Negative    Negative    Negative  Negative    Negative    Negative   06/20/2021   9:14 AM Negative    Negative    Negative  Negative    Negative    Negative   03/22/2021 11:49 AM Negative  Negative   12/13/2020  9:33 AM Negative    Negative    Negative  Negative    Negative    Negative   05/05/2018 12:00 AM Negative    Negative    Negative  Negative    Negative    Negative   02/14/2018 12:00 AM Negative  Negative   01/17/2018 12:00 AM Negative    **POSITIVE**    Negative  Negative    Negative    Negative   10/30/2016 12:00 AM Negative    Negative    Negative  Negative    Negative    Negative   03/08/2016 12:00 AM Negative  Negative   12/06/2015 12:00 AM Negative  Negative     Assessment: Dorsie comes in today for PrEP follow up. Continues to take Descovy every day without any issues or problems. No plans for insurance changes in 2025. No side effects or missed doses. Screened for acute HIV symptoms such as fatigue, muscle aches, rash, sore throat, lymphadenopathy, headache, night sweats, nausea/vomiting/diarrhea, and fever. Denies any symptoms.  Due for 3rd and final HPV vaccine today and accepts. Declines flu and COVID vaccines. Due for BMP check today and agrees to STI testing. He is no longer taking losartan and has stared taking carvedilol instead. Updated medication list.  Will see him back in 3 months.   Plan: - HIV antibody, BMP, RPR, and urine/rectal/pharyngeal cytologies for GC/chlamydia  - HPV vaccine #3/3 - Truvada x 3 months if HIV negative - Follow up with me on 10/08/23  Rockell Faulks L. Jannette Fogo, PharmD, BCIDP, AAHIVP, CPP Clinical Pharmacist Practitioner Infectious Diseases Clinical Pharmacist [facility] 07/08/2023, 1:19 PM

## 2023-07-09 ENCOUNTER — Other Ambulatory Visit: Payer: Self-pay

## 2023-07-09 ENCOUNTER — Ambulatory Visit (INDEPENDENT_AMBULATORY_CARE_PROVIDER_SITE_OTHER): Payer: No Typology Code available for payment source | Admitting: Pharmacist

## 2023-07-09 ENCOUNTER — Other Ambulatory Visit: Payer: Self-pay | Admitting: Pharmacist

## 2023-07-09 ENCOUNTER — Other Ambulatory Visit (HOSPITAL_COMMUNITY)
Admission: RE | Admit: 2023-07-09 | Discharge: 2023-07-09 | Disposition: A | Discharge status: Home / Self Care | Payer: No Typology Code available for payment source | Source: Ambulatory Visit | Attending: Infectious Disease | Admitting: Infectious Disease

## 2023-07-09 DIAGNOSIS — Z113 Encounter for screening for infections with a predominantly sexual mode of transmission: Secondary | ICD-10-CM | POA: Diagnosis present

## 2023-07-09 DIAGNOSIS — Z23 Encounter for immunization: Secondary | ICD-10-CM

## 2023-07-09 DIAGNOSIS — Z0000 Encounter for general adult medical examination without abnormal findings: Secondary | ICD-10-CM

## 2023-07-09 DIAGNOSIS — Z79899 Other long term (current) drug therapy: Secondary | ICD-10-CM

## 2023-07-09 MED ORDER — EMTRICITABINE-TENOFOVIR DF 200-300 MG PO TABS
1.0000 | ORAL_TABLET | Freq: Every day | ORAL | 2 refills | Status: DC
  Filled 2023-07-09: qty 30, 30d supply, fill #0
  Filled 2023-07-25: qty 30, 30d supply, fill #1
  Filled 2023-08-30: qty 30, 30d supply, fill #2

## 2023-07-09 NOTE — Progress Notes (Signed)
Specialty Pharmacy Initial Fill Coordination Note  Alexander Solomon is a 37 y.o. male contacted today regarding refills of specialty medication(s) Emtricitabine-Tenofovir Df   Patient requested Delivery   Delivery date: 07/10/23   Verified address: [address] [name] [address]   Medication will be filled on 10/15/4.   Patient is aware of $0 copayment.

## 2023-07-09 NOTE — Progress Notes (Signed)
Specialty Pharmacy Ongoing Clinical Assessment Note  Alexander Solomon is a 37 y.o. male who is being followed by the specialty pharmacy service for RxSp HIV PrEP   Patient's specialty medication(s) reviewed today: Emtricitabine-Tenofovir Df   Missed doses in the last 4 weeks: 0   Patient/Caregiver did not have any additional questions or concerns.   Therapeutic benefit summary: Patient is achieving benefit   Adverse events/side effects summary: No adverse events/side effects   Patient's therapy is appropriate to: Continue    Goals Addressed             This Visit's Progress    Comply with lab assessments       Patient is on track. Patient will adhere to provider and/or lab appointments.      Improve or maintain quality of life       Patient is on track. Patient will be monitored by provider to determine if a change in treatment plan is warranted.      Maintain optimal adherence to therapy       Patient is on track. Patient will maintain adherence.         Follow up:  6 months  Alexander Solomon, PharmD, BCIDP, AAHIVP, CPP Clinical Pharmacist Practitioner Infectious Diseases Clinical Pharmacist [facility] 07/09/2023, 9:41 AM

## 2023-07-10 LAB — CYTOLOGY, (ORAL, ANAL, URETHRAL) ANCILLARY ONLY
Chlamydia: NEGATIVE
Chlamydia: NEGATIVE
Comment: NEGATIVE
Comment: NEGATIVE
Comment: NORMAL
Comment: NORMAL
Neisseria Gonorrhea: NEGATIVE
Neisseria Gonorrhea: NEGATIVE

## 2023-07-10 LAB — URINE CYTOLOGY ANCILLARY ONLY
Chlamydia: NEGATIVE
Comment: NEGATIVE
Comment: NORMAL
Neisseria Gonorrhea: NEGATIVE

## 2023-07-11 LAB — HIV ANTIBODY (ROUTINE TESTING W REFLEX): HIV 1&2 Ab, 4th Generation: NONREACTIVE

## 2023-07-11 LAB — BASIC METABOLIC PANEL
BUN: 25 mg/dL (ref 7–25)
CO2: 26 mmol/L (ref 20–32)
Calcium: 9.9 mg/dL (ref 8.6–10.3)
Chloride: 104 mmol/L (ref 98–110)
Creat: 0.89 mg/dL (ref 0.60–1.26)
Glucose, Bld: 123 mg/dL — ABNORMAL HIGH (ref 65–99)
Potassium: 4.1 mmol/L (ref 3.5–5.3)
Sodium: 140 mmol/L (ref 135–146)

## 2023-07-11 LAB — RPR: RPR Ser Ql: NONREACTIVE

## 2023-07-17 ENCOUNTER — Other Ambulatory Visit (HOSPITAL_BASED_OUTPATIENT_CLINIC_OR_DEPARTMENT_OTHER): Payer: Self-pay

## 2023-07-17 MED ORDER — AMPHETAMINE SULFATE 10 MG PO TABS
20.0000 mg | ORAL_TABLET | Freq: Every morning | ORAL | 0 refills | Status: DC
  Filled 2023-07-17: qty 90, 45d supply, fill #0

## 2023-07-18 ENCOUNTER — Other Ambulatory Visit (HOSPITAL_BASED_OUTPATIENT_CLINIC_OR_DEPARTMENT_OTHER): Payer: Self-pay

## 2023-07-18 MED ORDER — CARVEDILOL 6.25 MG PO TABS
6.2500 mg | ORAL_TABLET | Freq: Two times a day (BID) | ORAL | 2 refills | Status: DC
  Filled 2023-07-18: qty 60, 30d supply, fill #0
  Filled 2023-08-16: qty 60, 30d supply, fill #1
  Filled 2023-09-14 (×2): qty 60, 30d supply, fill #2

## 2023-07-18 MED ORDER — MOUNJARO 10 MG/0.5ML ~~LOC~~ SOAJ
10.0000 mg | SUBCUTANEOUS | 1 refills | Status: AC
  Filled 2023-07-18 – 2024-01-27 (×3): qty 6, 84d supply, fill #0
  Filled 2024-05-26: qty 6, 84d supply, fill #1
  Filled 2024-05-27: qty 2, 28d supply, fill #1

## 2023-07-19 ENCOUNTER — Other Ambulatory Visit (HOSPITAL_BASED_OUTPATIENT_CLINIC_OR_DEPARTMENT_OTHER): Payer: Self-pay

## 2023-07-19 MED ORDER — MOUNJARO 10 MG/0.5ML ~~LOC~~ SOAJ
10.0000 mg | SUBCUTANEOUS | 1 refills | Status: DC
  Filled 2023-08-28 – 2023-09-04 (×2): qty 2, 28d supply, fill #0

## 2023-07-19 MED ORDER — HYDROCHLOROTHIAZIDE 12.5 MG PO TABS: 12.5000 mg | ORAL_TABLET | Freq: Every morning | ORAL | 2 refills | Status: DC

## 2023-07-19 MED ORDER — ACCU-CHEK SOFTCLIX LANCETS MISC: 1 refills | Status: AC

## 2023-07-19 MED ORDER — SUCRALFATE 1 G PO TABS: 1.0000 g | ORAL_TABLET | Freq: Two times a day (BID) | ORAL | 1 refills | Status: AC

## 2023-07-19 MED ORDER — FLUTICASONE PROPIONATE 50 MCG/ACT NA SUSP
2.0000 | Freq: Every day | NASAL | 3 refills | Status: DC
  Filled 2023-08-16: qty 48, 90d supply, fill #0

## 2023-07-19 MED ORDER — ACCU-CHEK GUIDE VI STRP: ORAL_STRIP | 1 refills | Status: AC

## 2023-07-19 MED ORDER — SIMVASTATIN 40 MG PO TABS: 40.0000 mg | ORAL_TABLET | Freq: Every day | ORAL | 1 refills | Status: AC

## 2023-07-19 MED ORDER — MOUNJARO 10 MG/0.5ML ~~LOC~~ SOAJ
10.0000 mg | SUBCUTANEOUS | 1 refills | Status: AC
  Filled 2023-08-06: qty 2, 28d supply, fill #0
  Filled 2023-08-06 – 2023-11-11 (×4): qty 6, 84d supply, fill #0

## 2023-07-22 ENCOUNTER — Other Ambulatory Visit (HOSPITAL_BASED_OUTPATIENT_CLINIC_OR_DEPARTMENT_OTHER): Payer: Self-pay

## 2023-07-22 MED ORDER — "SYRINGE 25G X 1-1/2"" 3 ML MISC"
0 refills | Status: DC
  Filled 2023-07-22: qty 12, 365d supply, fill #0

## 2023-07-22 MED ORDER — CYANOCOBALAMIN 1000 MCG/ML IJ SOLN
1000.0000 ug | INTRAMUSCULAR | 0 refills | Status: DC
  Filled 2023-07-22: qty 3, 90d supply, fill #0

## 2023-07-22 MED ORDER — VITAMIN D (ERGOCALCIFEROL) 1.25 MG (50000 UNIT) PO CAPS
50000.0000 [IU] | ORAL_CAPSULE | ORAL | 0 refills | Status: DC
  Filled 2023-07-22: qty 12, 84d supply, fill #0

## 2023-07-23 ENCOUNTER — Other Ambulatory Visit (HOSPITAL_BASED_OUTPATIENT_CLINIC_OR_DEPARTMENT_OTHER): Payer: Self-pay

## 2023-07-23 ENCOUNTER — Other Ambulatory Visit: Payer: Self-pay

## 2023-07-25 ENCOUNTER — Other Ambulatory Visit (HOSPITAL_COMMUNITY): Payer: Self-pay | Admitting: Pharmacy Technician

## 2023-07-25 ENCOUNTER — Other Ambulatory Visit (HOSPITAL_COMMUNITY): Payer: Self-pay

## 2023-07-25 NOTE — Progress Notes (Signed)
Specialty Pharmacy Refill Coordination Note  Alexander Solomon is a 37 y.o. male contacted today regarding refills of specialty medication(s) Emtricitabine-Tenofovir Df   Patient requested Delivery   Delivery date: 08/07/23   Verified address: [address]  [name]   Medication will be filled on 08/06/23.

## 2023-08-06 ENCOUNTER — Other Ambulatory Visit (HOSPITAL_BASED_OUTPATIENT_CLINIC_OR_DEPARTMENT_OTHER): Payer: Self-pay

## 2023-08-12 ENCOUNTER — Other Ambulatory Visit (HOSPITAL_BASED_OUTPATIENT_CLINIC_OR_DEPARTMENT_OTHER): Payer: Self-pay

## 2023-08-12 MED ORDER — MOUNJARO 10 MG/0.5ML ~~LOC~~ SOAJ
10.0000 mg | SUBCUTANEOUS | 0 refills | Status: AC
  Filled 2023-08-12: qty 2, 28d supply, fill #0

## 2023-08-13 ENCOUNTER — Other Ambulatory Visit (HOSPITAL_BASED_OUTPATIENT_CLINIC_OR_DEPARTMENT_OTHER): Payer: Self-pay

## 2023-08-16 ENCOUNTER — Other Ambulatory Visit (HOSPITAL_BASED_OUTPATIENT_CLINIC_OR_DEPARTMENT_OTHER): Payer: Self-pay

## 2023-08-20 ENCOUNTER — Other Ambulatory Visit (HOSPITAL_BASED_OUTPATIENT_CLINIC_OR_DEPARTMENT_OTHER): Payer: Self-pay

## 2023-08-27 ENCOUNTER — Other Ambulatory Visit: Payer: Self-pay

## 2023-08-28 ENCOUNTER — Other Ambulatory Visit (HOSPITAL_BASED_OUTPATIENT_CLINIC_OR_DEPARTMENT_OTHER): Payer: Self-pay

## 2023-08-30 ENCOUNTER — Other Ambulatory Visit (HOSPITAL_COMMUNITY): Payer: Self-pay

## 2023-08-30 ENCOUNTER — Other Ambulatory Visit (HOSPITAL_COMMUNITY): Payer: Self-pay | Admitting: Pharmacy Technician

## 2023-08-30 ENCOUNTER — Other Ambulatory Visit: Payer: Self-pay

## 2023-08-30 NOTE — Progress Notes (Signed)
Specialty Pharmacy Refill Coordination Note  Alexander Solomon is a 37 y.o. male contacted today regarding refills of specialty medication(s) Emtricitabine-Tenofovir Df   Patient requested Delivery   Delivery date: 09/06/23   Verified address: [address] [name], [place] [address]   Medication will be filled on 09/05/23.

## 2023-09-02 ENCOUNTER — Other Ambulatory Visit (HOSPITAL_BASED_OUTPATIENT_CLINIC_OR_DEPARTMENT_OTHER): Payer: Self-pay

## 2023-09-02 MED ORDER — AMPHETAMINE SULFATE 10 MG PO TABS
ORAL_TABLET | ORAL | 0 refills | Status: DC
  Filled 2023-09-02: qty 90, 30d supply, fill #0

## 2023-09-04 ENCOUNTER — Other Ambulatory Visit (HOSPITAL_BASED_OUTPATIENT_CLINIC_OR_DEPARTMENT_OTHER): Payer: Self-pay

## 2023-09-14 ENCOUNTER — Other Ambulatory Visit (HOSPITAL_BASED_OUTPATIENT_CLINIC_OR_DEPARTMENT_OTHER): Payer: Self-pay

## 2023-09-27 ENCOUNTER — Other Ambulatory Visit (HOSPITAL_COMMUNITY): Payer: Self-pay

## 2023-10-01 ENCOUNTER — Other Ambulatory Visit (HOSPITAL_BASED_OUTPATIENT_CLINIC_OR_DEPARTMENT_OTHER): Payer: Self-pay

## 2023-10-01 MED ORDER — LOSARTAN POTASSIUM-HCTZ 100-25 MG PO TABS
1.0000 | ORAL_TABLET | Freq: Every day | ORAL | 1 refills | Status: DC
  Filled 2023-10-01: qty 30, 30d supply, fill #0
  Filled 2023-11-09: qty 30, 30d supply, fill #1

## 2023-10-01 MED ORDER — CARVEDILOL 6.25 MG PO TABS
6.2500 mg | ORAL_TABLET | Freq: Two times a day (BID) | ORAL | 2 refills | Status: DC
  Filled 2023-10-01 – 2023-10-03 (×3): qty 60, 30d supply, fill #0

## 2023-10-02 ENCOUNTER — Other Ambulatory Visit (HOSPITAL_BASED_OUTPATIENT_CLINIC_OR_DEPARTMENT_OTHER): Payer: Self-pay

## 2023-10-03 ENCOUNTER — Other Ambulatory Visit (HOSPITAL_BASED_OUTPATIENT_CLINIC_OR_DEPARTMENT_OTHER): Payer: Self-pay

## 2023-10-03 MED ORDER — NEEDLE (DISP) 25G X 1-1/2" MISC
0 refills | Status: AC
  Filled 2023-12-15: qty 100, 100d supply, fill #0

## 2023-10-03 MED ORDER — VITAMIN D (ERGOCALCIFEROL) 1.25 MG (50000 UNIT) PO CAPS
50000.0000 [IU] | ORAL_CAPSULE | ORAL | 0 refills | Status: DC
  Filled 2023-10-03: qty 12, 84d supply, fill #0

## 2023-10-03 MED ORDER — CYANOCOBALAMIN 1000 MCG/ML IJ SOLN
1000.0000 ug | INTRAMUSCULAR | 0 refills | Status: AC
  Filled 2023-10-03: qty 1, 30d supply, fill #0
  Filled 2023-10-03: qty 2, 60d supply, fill #0
  Filled 2023-12-15: qty 3, 90d supply, fill #1

## 2023-10-08 ENCOUNTER — Other Ambulatory Visit (HOSPITAL_COMMUNITY)
Admission: RE | Admit: 2023-10-08 | Discharge: 2023-10-08 | Disposition: A | Discharge status: Home / Self Care | Payer: No Typology Code available for payment source | Source: Ambulatory Visit | Attending: Infectious Disease | Admitting: Infectious Disease

## 2023-10-08 ENCOUNTER — Other Ambulatory Visit: Payer: Self-pay

## 2023-10-08 ENCOUNTER — Ambulatory Visit (INDEPENDENT_AMBULATORY_CARE_PROVIDER_SITE_OTHER): Payer: No Typology Code available for payment source | Admitting: Pharmacist

## 2023-10-08 DIAGNOSIS — Z113 Encounter for screening for infections with a predominantly sexual mode of transmission: Secondary | ICD-10-CM | POA: Diagnosis present

## 2023-10-08 DIAGNOSIS — Z79899 Other long term (current) drug therapy: Secondary | ICD-10-CM

## 2023-10-08 NOTE — Progress Notes (Signed)
 HPI: Alexander Solomon is a 38 y.o. male who presents to the [facility] pharmacy clinic for HIV PrEP follow-up.  Insured   [x]    Uninsured  []    Patient Active Problem List   Diagnosis Date Noted   On pre-exposure prophylaxis for HIV 04/03/2023   Prediabetes 07/14/2020   Vitamin D  insufficiency 07/14/2020   DOE (dyspnea on exertion) 03/14/2020   Acute pain of right knee 11/05/2016   Morbid obesity (HCC) 09/07/2015   GERD (gastroesophageal reflux disease) 09/07/2015   Seasonal allergies 09/07/2015   Attention deficit disorder 09/07/2015   Anxiety 09/07/2015   PVC (premature ventricular contraction) 05/04/2015   Benign essential HTN 05/04/2015   Hyperlipidemia 05/04/2015   Obstructive sleep apnea 05/04/2015    Patient's Medications  New Prescriptions   No medications on file  Previous Medications   ACCU-CHEK SOFTCLIX LANCETS LANCETS    Use as directed daily   AMPHETAMINE  SULFATE 10 MG TABS    Take 2 tablets (20 mg total) by mouth in the morning and may take another tablet in the midafternoon as needed as directed.   CYANOCOBALAMIN  (VITAMIN B12) 1000 MCG/ML INJECTION    Inject 1 mL (1,000 mcg total) into the muscle every 30 (thirty) days.   CYCLOBENZAPRINE (FLEXERIL) 10 MG TABLET    Take 10 mg by mouth 3 (three) times daily as needed for muscle spasms. Reported on 09/07/2015   DICLOFENAC (VOLTAREN) 50 MG EC TABLET    Take 50 mg by mouth 2 (two) times daily as needed.   EMTRICITABINE -TENOFOVIR  (TRUVADA ) 200-300 MG TABLET    Take 1 tablet by mouth daily.   FLUTICASONE  (FLONASE ) 50 MCG/ACT NASAL SPRAY    Place 2 sprays into both nostrils at bedtime.   GLUCOSE BLOOD (ACCU-CHEK GUIDE) TEST STRIP    Use as directed daily   LORATADINE (CLARITIN) 10 MG TABLET    Take 10 mg by mouth daily.   LOSARTAN -HYDROCHLOROTHIAZIDE  (HYZAAR ) 100-25 MG TABLET    Take 1 tablet by mouth daily.   NEEDLE, DISP, 25 G 25G X 1-1/2 MISC    Use to inject B12 once a month.   OMEPRAZOLE  (PRILOSEC) 40 MG CAPSULE    Take  40 mg by mouth every evening.    SILDENAFIL  (VIAGRA ) 50 MG TABLET    Take 1 tablet by mouth once daily as needed 30 minutes prior to sexual activity.   SIMVASTATIN  (ZOCOR ) 40 MG TABLET    Take 1 tablet (40 mg total) by mouth daily.   SUCRALFATE  (CARAFATE ) 1 G TABLET    Take 1 tablet (1 g total) by mouth 2 (two) times daily.   TIRZEPATIDE  (MOUNJARO ) 10 MG/0.5ML PEN    Inject 10 mg into the skin once a week.   TIRZEPATIDE  (MOUNJARO ) 10 MG/0.5ML PEN    Inject 10 mg into the skin once a week.   TIRZEPATIDE  (MOUNJARO ) 10 MG/0.5ML PEN    Inject 10 mg into the skin once a week.   VITAMIN D , ERGOCALCIFEROL , (DRISDOL ) 1.25 MG (50000 UNIT) CAPS CAPSULE    Take 1 capsule (50,000 Units total) by mouth every 7 (seven) days.   VITAMIN D , ERGOCALCIFEROL , (DRISDOL ) 1.25 MG (50000 UNIT) CAPS CAPSULE    Take 1 capsule (50,000 Units total) by mouth once a week.  Modified Medications   No medications on file  Discontinued Medications   ACETAMINOPHEN -CODEINE  300-30 MG TABLET    Take 1 tablet by mouth every 12 (twelve) hours as needed. Okay to take NSAIDs with this medication  AMPHETAMINE  SULFATE 10 MG TABS    Take 2 tablets (20 mg total) by mouth every morning. May also take 1 tablet (10 mg total) midafternoon as needed as directed   AMPHETAMINE  SULFATE 10 MG TABS    Take 2 tablets (20 mg total) by mouth every morning and may redose another tablet in the midafternoon as needed   AMPHETAMINE  SULFATE 10 MG TABS    Take 2 tablets (20 mg total) by mouth in the morning AND may 1 tablet (10 mg total) daily in the midafternoon as neeeded as directed   AMPHETAMINE  SULFATE 10 MG TABS    Take 2 tablets (20 mg total) by mouth in the morning AND may redose with 1 tablet (10 mg total) daily in the midafternoon as needed   CARVEDILOL  (COREG ) 3.125 MG TABLET    Take 1 tablet (3.125 mg total) by mouth 2 (two) times daily with food.   CARVEDILOL  (COREG ) 6.25 MG TABLET    Take 1 tablet (6.25 mg total) by mouth 2 (two) times daily with  food,   CYANOCOBALAMIN  (VITAMIN B12) 1000 MCG/ML INJECTION    Inject 1 mL (1,000 mcg total) into the muscle every 30 (thirty) days.   FLUTICASONE  (FLONASE ) 50 MCG/ACT NASAL SPRAY    Place 2 sprays into both nostrils daily.   HYDROCHLOROTHIAZIDE  (HYDRODIURIL ) 12.5 MG TABLET    Take 1 tablet (12.5 mg total) by mouth every morning.   LISDEXAMFETAMINE (VYVANSE) 20 MG CAPSULE    Take 60 mg by mouth daily.   METFORMIN  (GLUCOPHAGE ) 500 MG TABLET    Take 1 tablet (500 mg total) by mouth daily before supper.   MULTIPLE VITAMIN (MULTIVITAMIN) TABLET    Take 1 tablet by mouth daily.   SEMAGLUTIDE (RYBELSUS) 3 MG TABS    Take 4 tablets by mouth daily.   SIMVASTATIN  (ZOCOR ) 40 MG TABLET    Take 40 mg by mouth daily.   SUCRALFATE  (CARAFATE ) 1 G TABLET    Take 1 tablet by mouth 3 (three) times daily.   SYRINGE/NEEDLE, DISP, (SYRINGE 3CC/25GX1-1/2) 25G X 1-1/2 3 ML MISC    Use to inject B12 once a month   TIRZEPATIDE  (MOUNJARO ) 10 MG/0.5ML PEN    Inject 10 mg into the skin once a week.       05/05/2018   11:42 AM 05/05/2018    9:35 AM 02/14/2018    9:14 AM 01/17/2018    9:07 AM  [facility] HIV PREP FLOWSHEET RESULTS  Insurance Status  Insured Insured Insured  How did you hear?   Family Physician Referred from Dr Loreli  Gender at birth  Male Male Male  Gender identity  cis-Male cis-Male cis-Male  Risk for HIV >5 partners in past 6 mos (regardless of condom use);Condomless vaginal or anal intercourse  Condomless vaginal or anal intercourse;>5 partners in past 6 mos (regardless of condom use);Hx of STI Condomless vaginal or anal intercourse  Sex Partners  Men only Men only Men only  # sex partners past 3-6 mos  4-6 4-6 1-3  Sex activity preferences  Insertive and receptive Insertive and receptive;Oral Insertive and receptive;Oral  Condom use  Yes Yes No  % condom use  90 95   Partners genders and ages   50 20-24;M 25-29;M 51-49 M 20-24;M 25-29;M 30-49  Treated for STI?  No Yes N/A  HIV symptoms?  N/A N/A N/A   PrEP Eligibility  CrCl >60 ml/min HIV negative;CrCl >60 ml/min;Substantial risk for HIV Substantial risk for HIV;HIV negative  Paper  work received?   No No    Labs:  SCr: Lab Results  Component Value Date   CREATININE 0.89 07/09/2023   CREATININE 0.77 09/27/2021   CREATININE 0.73 (L) 07/13/2020   CREATININE 0.82 02/14/2018   CREATININE 0.82 01/17/2018   HIV Lab Results  Component Value Date   HIV NON-REACTIVE 07/09/2023   HIV NON-REACTIVE 04/03/2023   HIV NON-REACTIVE 01/02/2023   HIV NON-REACTIVE 10/08/2022   HIV NON-REACTIVE 07/02/2022   Hepatitis B Lab Results  Component Value Date   HEPBSAB REACTIVE (A) 04/12/2022   HEPBSAG NEGATIVE 09/07/2015   Hepatitis C Lab Results  Component Value Date   HEPCAB NON-REACTIVE 06/20/2021   Hepatitis A Lab Results  Component Value Date   HAV REACTIVE (A) 01/17/2018   RPR and STI Lab Results  Component Value Date   LABRPR NON-REACTIVE 07/09/2023   LABRPR NON-REACTIVE 04/03/2023   LABRPR NON-REACTIVE 01/02/2023   LABRPR NON-REACTIVE 10/08/2022   LABRPR NON-REACTIVE 07/02/2022    STI Results GC CT  07/09/2023  9:15 AM Negative    Negative    Negative  Negative    Negative    Negative   04/03/2023  8:46 AM Negative    Negative    Negative  Negative    Negative    Negative   01/02/2023  9:00 AM Negative    Negative    Negative  Negative    Negative    Negative   10/08/2022  9:12 AM Negative    Negative    Negative  Negative    Negative    Negative   07/02/2022  8:54 AM Negative    Negative    Negative  Negative    Negative    Negative   01/03/2022  9:42 AM Negative    Negative    Negative  Negative    Negative    Negative   09/27/2021  8:51 AM Negative    Negative    Negative  Negative    Negative    Negative   06/20/2021  9:14 AM Negative    Negative    Negative  Negative    Negative    Negative   03/22/2021 11:49 AM Negative  Negative   12/13/2020  9:33 AM Negative    Negative     Negative  Negative    Negative    Negative   05/05/2018 12:00 AM Negative    Negative    Negative  Negative    Negative    Negative   02/14/2018 12:00 AM Negative  Negative   01/17/2018 12:00 AM Negative    **POSITIVE**    Negative  Negative    Negative    Negative   10/30/2016 12:00 AM Negative    Negative    Negative  Negative    Negative    Negative   03/08/2016 12:00 AM Negative  Negative   12/06/2015 12:00 AM Negative  Negative     Assessment: Kamaron presents to [facility] pharmacy clinic today for PrEP follow up. Continues to take Truvada  every day without any issues or problems. No plans for insurance changes in 2025. Denies side effects or missed doses. Screened for acute HIV symptoms such as fatigue, muscle aches, rash, sore throat, lymphadenopathy, headache, night sweats, nausea/vomiting/diarrhea, and fever. Denies any symptoms. BMP from last visit on 07/09/23 within normal limits. Patient's medication list updated.  Plan: - HIV ab/ag - Truvada  refill if HIV negative - STI testing RPR plus oral/rectal/urine cytologies for chlamydia and gonorrhea -  Follow up with Cassie on 12/31/23  Omar Norleen GAILS. Shepard, PharmD Candidate [facility] [employer] 10/08/2023, 9:07 AM

## 2023-10-09 ENCOUNTER — Other Ambulatory Visit: Payer: Self-pay

## 2023-10-09 LAB — CYTOLOGY, (ORAL, ANAL, URETHRAL) ANCILLARY ONLY
Chlamydia: NEGATIVE
Chlamydia: NEGATIVE
Comment: NEGATIVE
Comment: NEGATIVE
Comment: NORMAL
Comment: NORMAL
Neisseria Gonorrhea: NEGATIVE
Neisseria Gonorrhea: NEGATIVE

## 2023-10-09 LAB — RPR: RPR Ser Ql: NONREACTIVE

## 2023-10-09 LAB — HIV ANTIBODY (ROUTINE TESTING W REFLEX): HIV 1&2 Ab, 4th Generation: NONREACTIVE

## 2023-10-09 LAB — URINE CYTOLOGY ANCILLARY ONLY
Chlamydia: NEGATIVE
Comment: NEGATIVE
Comment: NORMAL
Neisseria Gonorrhea: NEGATIVE

## 2023-10-10 ENCOUNTER — Other Ambulatory Visit (HOSPITAL_BASED_OUTPATIENT_CLINIC_OR_DEPARTMENT_OTHER): Payer: Self-pay

## 2023-10-10 MED ORDER — ZEPBOUND 2.5 MG/0.5ML ~~LOC~~ SOAJ
2.5000 mg | SUBCUTANEOUS | 0 refills | Status: AC
  Filled 2023-10-10: qty 2, 28d supply, fill #0

## 2023-10-11 ENCOUNTER — Other Ambulatory Visit: Payer: Self-pay

## 2023-10-11 ENCOUNTER — Other Ambulatory Visit (HOSPITAL_BASED_OUTPATIENT_CLINIC_OR_DEPARTMENT_OTHER): Payer: Self-pay

## 2023-10-11 ENCOUNTER — Other Ambulatory Visit: Payer: Self-pay | Admitting: Pharmacist

## 2023-10-11 DIAGNOSIS — Z79899 Other long term (current) drug therapy: Secondary | ICD-10-CM

## 2023-10-11 MED ORDER — EMTRICITABINE-TENOFOVIR DF 200-300 MG PO TABS
1.0000 | ORAL_TABLET | Freq: Every day | ORAL | 2 refills | Status: DC
  Filled 2023-10-14 (×3): qty 30, 30d supply, fill #0
  Filled 2023-11-04: qty 30, 30d supply, fill #1
  Filled 2023-12-05: qty 30, 30d supply, fill #2

## 2023-10-14 ENCOUNTER — Other Ambulatory Visit (HOSPITAL_COMMUNITY): Payer: Self-pay

## 2023-10-14 ENCOUNTER — Other Ambulatory Visit: Payer: Self-pay

## 2023-10-14 ENCOUNTER — Other Ambulatory Visit (HOSPITAL_BASED_OUTPATIENT_CLINIC_OR_DEPARTMENT_OTHER): Payer: Self-pay

## 2023-10-14 NOTE — Progress Notes (Signed)
10/14/23: Truvada (CMA)  Patient called the Specialty Pharmacy. Patient does not have supply for tonight. Patient will pick up today at [facility].

## 2023-10-14 NOTE — Progress Notes (Signed)
Specialty Pharmacy Refill Coordination Note  Alexander Solomon is a 38 y.o. male contacted today regarding refills of specialty medication(s) Emtricitabine-Tenofovir DF (TRUVADA)   Patient requested [name] at [facility] [place] date: 10/12/23   Medication will be filled on 01.20.25.

## 2023-10-15 ENCOUNTER — Other Ambulatory Visit (HOSPITAL_BASED_OUTPATIENT_CLINIC_OR_DEPARTMENT_OTHER): Payer: Self-pay

## 2023-10-17 ENCOUNTER — Other Ambulatory Visit (HOSPITAL_BASED_OUTPATIENT_CLINIC_OR_DEPARTMENT_OTHER): Payer: Self-pay

## 2023-10-18 ENCOUNTER — Other Ambulatory Visit (HOSPITAL_BASED_OUTPATIENT_CLINIC_OR_DEPARTMENT_OTHER): Payer: Self-pay

## 2023-10-18 ENCOUNTER — Other Ambulatory Visit: Payer: Self-pay

## 2023-10-18 MED ORDER — AMPHETAMINE SULFATE 10 MG PO TABS
20.0000 mg | ORAL_TABLET | Freq: Every morning | ORAL | 0 refills | Status: DC
  Filled 2023-10-18: qty 90, 30d supply, fill #0

## 2023-10-18 MED ORDER — ESCITALOPRAM OXALATE 10 MG PO TABS
10.0000 mg | ORAL_TABLET | Freq: Every day | ORAL | 3 refills | Status: DC
  Filled 2023-10-18: qty 90, 90d supply, fill #0
  Filled 2024-01-15: qty 90, 90d supply, fill #1
  Filled 2024-04-14: qty 90, 90d supply, fill #2
  Filled 2024-07-15: qty 90, 90d supply, fill #3

## 2023-10-30 ENCOUNTER — Other Ambulatory Visit (HOSPITAL_COMMUNITY): Payer: Self-pay

## 2023-11-04 ENCOUNTER — Other Ambulatory Visit: Payer: Self-pay

## 2023-11-04 ENCOUNTER — Other Ambulatory Visit (HOSPITAL_BASED_OUTPATIENT_CLINIC_OR_DEPARTMENT_OTHER): Payer: Self-pay

## 2023-11-04 ENCOUNTER — Other Ambulatory Visit (HOSPITAL_COMMUNITY): Payer: Self-pay

## 2023-11-04 MED ORDER — SIMVASTATIN 40 MG PO TABS
40.0000 mg | ORAL_TABLET | Freq: Every evening | ORAL | 1 refills | Status: DC
  Filled 2023-11-04: qty 90, 90d supply, fill #0
  Filled 2024-01-31: qty 90, 90d supply, fill #1
  Filled 2024-04-14: qty 20, 20d supply, fill #2

## 2023-11-04 MED ORDER — OMEPRAZOLE 40 MG PO CPDR
40.0000 mg | DELAYED_RELEASE_CAPSULE | Freq: Every morning | ORAL | 1 refills | Status: DC
  Filled 2023-11-04: qty 90, 90d supply, fill #0
  Filled 2024-01-27: qty 90, 90d supply, fill #1
  Filled 2024-04-14: qty 20, 20d supply, fill #2

## 2023-11-04 MED ORDER — TESTOSTERONE 1.62 % TD GEL
2.0000 | Freq: Every morning | TRANSDERMAL | 1 refills | Status: AC
  Filled 2023-11-04: qty 75, 30d supply, fill #0

## 2023-11-04 NOTE — Progress Notes (Signed)
 Specialty Pharmacy Refill Coordination Note  Alexander Solomon is a 38 y.o. male contacted today regarding refills of specialty medication(s) Emtricitabine -Tenofovir  DF (TRUVADA )   Patient requested Delivery   Delivery date: 11/11/23   Verified address: [address] [name],  [place] [address]   Medication will be filled on 11/08/23.

## 2023-11-05 ENCOUNTER — Other Ambulatory Visit (HOSPITAL_BASED_OUTPATIENT_CLINIC_OR_DEPARTMENT_OTHER): Payer: Self-pay

## 2023-11-05 ENCOUNTER — Other Ambulatory Visit (HOSPITAL_COMMUNITY): Payer: Self-pay

## 2023-11-07 ENCOUNTER — Other Ambulatory Visit: Payer: Self-pay | Admitting: Urology

## 2023-11-07 ENCOUNTER — Other Ambulatory Visit (HOSPITAL_BASED_OUTPATIENT_CLINIC_OR_DEPARTMENT_OTHER): Payer: Self-pay

## 2023-11-07 DIAGNOSIS — E230 Hypopituitarism: Secondary | ICD-10-CM

## 2023-11-08 ENCOUNTER — Other Ambulatory Visit: Payer: Self-pay

## 2023-11-09 ENCOUNTER — Other Ambulatory Visit (HOSPITAL_BASED_OUTPATIENT_CLINIC_OR_DEPARTMENT_OTHER): Payer: Self-pay

## 2023-11-11 ENCOUNTER — Other Ambulatory Visit (HOSPITAL_BASED_OUTPATIENT_CLINIC_OR_DEPARTMENT_OTHER): Payer: Self-pay

## 2023-11-12 ENCOUNTER — Other Ambulatory Visit (HOSPITAL_BASED_OUTPATIENT_CLINIC_OR_DEPARTMENT_OTHER): Payer: Self-pay

## 2023-11-15 ENCOUNTER — Other Ambulatory Visit (HOSPITAL_BASED_OUTPATIENT_CLINIC_OR_DEPARTMENT_OTHER): Payer: Self-pay

## 2023-11-15 MED ORDER — TESTOSTERONE CYPIONATE 100 MG/ML IM SOLN
100.0000 mg | INTRAMUSCULAR | 1 refills | Status: DC
  Filled 2023-11-16: qty 10, 70d supply, fill #0
  Filled 2024-01-27: qty 10, 70d supply, fill #1

## 2023-11-16 ENCOUNTER — Other Ambulatory Visit (HOSPITAL_BASED_OUTPATIENT_CLINIC_OR_DEPARTMENT_OTHER): Payer: Self-pay

## 2023-11-18 ENCOUNTER — Other Ambulatory Visit: Payer: Self-pay

## 2023-11-18 ENCOUNTER — Other Ambulatory Visit (HOSPITAL_BASED_OUTPATIENT_CLINIC_OR_DEPARTMENT_OTHER): Payer: Self-pay

## 2023-11-18 ENCOUNTER — Encounter: Payer: Self-pay | Admitting: Urology

## 2023-11-19 ENCOUNTER — Encounter (HOSPITAL_BASED_OUTPATIENT_CLINIC_OR_DEPARTMENT_OTHER): Payer: Self-pay

## 2023-11-19 ENCOUNTER — Other Ambulatory Visit (HOSPITAL_BASED_OUTPATIENT_CLINIC_OR_DEPARTMENT_OTHER): Payer: Self-pay

## 2023-11-20 ENCOUNTER — Other Ambulatory Visit (HOSPITAL_BASED_OUTPATIENT_CLINIC_OR_DEPARTMENT_OTHER): Payer: Self-pay

## 2023-11-28 ENCOUNTER — Ambulatory Visit
Admission: RE | Admit: 2023-11-28 | Discharge: 2023-11-28 | Disposition: A | Discharge status: Home / Self Care | Payer: No Typology Code available for payment source | Source: Ambulatory Visit | Attending: Urology | Admitting: Urology

## 2023-11-28 ENCOUNTER — Other Ambulatory Visit: Payer: Self-pay

## 2023-11-28 DIAGNOSIS — E230 Hypopituitarism: Secondary | ICD-10-CM

## 2023-11-28 MED ORDER — GADOPICLENOL 0.5 MMOL/ML IV SOLN
10.0000 mL | Freq: Once | INTRAVENOUS | Status: AC | PRN
  Administered 2023-11-28: 10 mL via INTRAVENOUS

## 2023-12-02 ENCOUNTER — Other Ambulatory Visit (HOSPITAL_COMMUNITY): Payer: Self-pay

## 2023-12-04 ENCOUNTER — Other Ambulatory Visit (HOSPITAL_COMMUNITY): Payer: Self-pay

## 2023-12-05 ENCOUNTER — Other Ambulatory Visit: Payer: Self-pay

## 2023-12-05 NOTE — Progress Notes (Signed)
 Specialty Pharmacy Refill Coordination Note  Alexander Solomon is a 38 y.o. male contacted today regarding refills of specialty medication(s) Emtricitabine-Tenofovir DF (TRUVADA)   Patient requested (Patient-Rptd) Delivery   Delivery date: (Patient-Rptd) 12/09/23   Verified address: (Patient-Rptd) [address] [name], [place] [address]   Medication will be filled on 12/06/23.

## 2023-12-06 ENCOUNTER — Other Ambulatory Visit (HOSPITAL_BASED_OUTPATIENT_CLINIC_OR_DEPARTMENT_OTHER): Payer: Self-pay

## 2023-12-06 MED ORDER — AMPHETAMINE SULFATE 10 MG PO TABS
20.0000 mg | ORAL_TABLET | ORAL | 0 refills | Status: DC
  Filled 2023-12-06: qty 90, 30d supply, fill #0

## 2023-12-15 ENCOUNTER — Other Ambulatory Visit (HOSPITAL_BASED_OUTPATIENT_CLINIC_OR_DEPARTMENT_OTHER): Payer: Self-pay

## 2023-12-15 ENCOUNTER — Other Ambulatory Visit (INDEPENDENT_AMBULATORY_CARE_PROVIDER_SITE_OTHER): Payer: Self-pay | Admitting: Bariatrics

## 2023-12-15 DIAGNOSIS — E559 Vitamin D deficiency, unspecified: Secondary | ICD-10-CM

## 2023-12-16 ENCOUNTER — Other Ambulatory Visit (HOSPITAL_BASED_OUTPATIENT_CLINIC_OR_DEPARTMENT_OTHER): Payer: Self-pay

## 2023-12-16 ENCOUNTER — Other Ambulatory Visit: Payer: Self-pay

## 2023-12-16 MED ORDER — LOSARTAN POTASSIUM-HCTZ 100-25 MG PO TABS
1.0000 | ORAL_TABLET | Freq: Every day | ORAL | 1 refills | Status: DC
  Filled 2023-12-16: qty 30, 30d supply, fill #0
  Filled 2024-01-15: qty 30, 30d supply, fill #1

## 2023-12-16 MED ORDER — FLUTICASONE PROPIONATE 50 MCG/ACT NA SUSP
2.0000 | Freq: Every day | NASAL | 3 refills | Status: AC
  Filled 2023-12-16: qty 48, 90d supply, fill #0
  Filled 2024-04-08: qty 48, 90d supply, fill #1
  Filled 2024-07-15: qty 48, 90d supply, fill #2

## 2023-12-16 MED ORDER — VITAMIN D (ERGOCALCIFEROL) 1.25 MG (50000 UNIT) PO CAPS
50000.0000 [IU] | ORAL_CAPSULE | ORAL | 0 refills | Status: AC
  Filled 2023-12-16: qty 12, 84d supply, fill #0

## 2023-12-16 MED ORDER — VITAMIN D (ERGOCALCIFEROL) 1.25 MG (50000 UNIT) PO CAPS
50000.0000 [IU] | ORAL_CAPSULE | ORAL | 0 refills | Status: AC
Start: 2023-12-16 — End: ?
  Filled 2023-12-16 – 2024-04-14 (×2): qty 12, 84d supply, fill #0

## 2023-12-17 ENCOUNTER — Other Ambulatory Visit: Payer: Self-pay

## 2023-12-18 ENCOUNTER — Other Ambulatory Visit: Payer: Self-pay

## 2023-12-30 NOTE — Progress Notes (Unsigned)
 HPI: Alexander Solomon is a 38 y.o. male who presents to the [facility] pharmacy clinic for HIV PrEP follow-up.  Insured   [x]    Uninsured  []    Patient Active Problem List   Diagnosis Date Noted   On pre-exposure prophylaxis for HIV 04/03/2023   Prediabetes 07/14/2020   Vitamin D insufficiency 07/14/2020   DOE (dyspnea on exertion) 03/14/2020   Acute pain of right knee 11/05/2016   Morbid obesity (HCC) 09/07/2015   GERD (gastroesophageal reflux disease) 09/07/2015   Seasonal allergies 09/07/2015   Attention deficit disorder 09/07/2015   Anxiety 09/07/2015   PVC (premature ventricular contraction) 05/04/2015   Benign essential HTN 05/04/2015   Hyperlipidemia 05/04/2015   Obstructive sleep apnea 05/04/2015    Patient's Medications  New Prescriptions   No medications on file  Previous Medications   ACCU-CHEK SOFTCLIX LANCETS LANCETS    Use as directed daily   AMPHETAMINE SULFATE 10 MG TABS    Take 2 tablets (20 mg total) by mouth in the morning and may take another tablet in the midafternoon as needed as directed.   AMPHETAMINE SULFATE 10 MG TABS    Take 2 tablets (20 mg total) by mouth every morning. May take 1 additional tablet in the mid-afternoon as needed.   CYANOCOBALAMIN (VITAMIN B12) 1000 MCG/ML INJECTION    Inject 1 mL (1,000 mcg total) into the muscle every 30 (thirty) days.   CYCLOBENZAPRINE (FLEXERIL) 10 MG TABLET    Take 10 mg by mouth 3 (three) times daily as needed for muscle spasms. Reported on 09/07/2015   DICLOFENAC (VOLTAREN) 50 MG EC TABLET    Take 50 mg by mouth 2 (two) times daily as needed.   EMTRICITABINE-TENOFOVIR (TRUVADA) 200-300 MG TABLET    Take 1 tablet by mouth daily.   ESCITALOPRAM (LEXAPRO) 10 MG TABLET    Take 1 tablet (10 mg total) by mouth daily.   FLUTICASONE (FLONASE) 50 MCG/ACT NASAL SPRAY    Place 2 sprays into both nostrils at bedtime.   GLUCOSE BLOOD (ACCU-CHEK GUIDE) TEST STRIP    Use as directed daily   LORATADINE (CLARITIN) 10 MG TABLET    Take  10 mg by mouth daily.   LOSARTAN-HYDROCHLOROTHIAZIDE (HYZAAR) 100-25 MG TABLET    Take 1 tablet by mouth daily.   NEEDLE, DISP, 25 G 25G X 1-1/2" MISC    Use to inject B12 once a month.   OMEPRAZOLE (PRILOSEC) 40 MG CAPSULE    Take 40 mg by mouth every evening.    OMEPRAZOLE (PRILOSEC) 40 MG CAPSULE    Take 1 capsule (40 mg total) by mouth every morning 1/2-1 hour before morning meal   SILDENAFIL (VIAGRA) 50 MG TABLET    Take 1 tablet by mouth once daily as needed 30 minutes prior to sexual activity.   SIMVASTATIN (ZOCOR) 40 MG TABLET    Take 1 tablet (40 mg total) by mouth daily.   SIMVASTATIN (ZOCOR) 40 MG TABLET    Take 1 tablet (40 mg total) by mouth every evening.   SUCRALFATE (CARAFATE) 1 G TABLET    Take 1 tablet (1 g total) by mouth 2 (two) times daily.   TESTOSTERONE 1.62 % GEL    Apply 2 pumps topically every morning.   TESTOSTERONE CYPIONATE (DEPOTESTOTERONE CYPIONATE) 100 MG/ML INJECTION    Inject 1 mL (100 mg total) into the muscle every 10 days.   TIRZEPATIDE (MOUNJARO) 10 MG/0.5ML PEN    Inject 10 mg into the skin once a  week.   TIRZEPATIDE (MOUNJARO) 10 MG/0.5ML PEN    Inject 10 mg into the skin once a week.   TIRZEPATIDE (MOUNJARO) 10 MG/0.5ML PEN    Inject 10 mg into the skin once a week.   TIRZEPATIDE (ZEPBOUND) 2.5 MG/0.5ML PEN    Inject 2.5 mg into the skin once a week.   VITAMIN D, ERGOCALCIFEROL, (DRISDOL) 1.25 MG (50000 UNIT) CAPS CAPSULE    Take 1 capsule (50,000 Units total) by mouth every 7 (seven) days.   VITAMIN D, ERGOCALCIFEROL, (DRISDOL) 1.25 MG (50000 UNIT) CAPS CAPSULE    Take 1 capsule (50,000 Units total) by mouth once a week.   VITAMIN D, ERGOCALCIFEROL, (DRISDOL) 1.25 MG (50000 UNIT) CAPS CAPSULE    Take 1 capsule (50,000 Units total) by mouth once a week.  Modified Medications   No medications on file  Discontinued Medications   No medications on file       05/05/2018   11:42 AM 05/05/2018    9:35 AM 02/14/2018    9:14 AM 01/17/2018    9:07 AM  [facility] HIV  PREP FLOWSHEET RESULTS  Insurance Status  Insured Insured Insured  How did you hear?   Family Physician Referred from Dr Clelia Croft  Gender at birth  Male Male Male  Gender identity  cis-Male cis-Male cis-Male  Risk for HIV >5 partners in past 6 mos (regardless of condom use);Condomless vaginal or anal intercourse  Condomless vaginal or anal intercourse;>5 partners in past 6 mos (regardless of condom use);Hx of STI Condomless vaginal or anal intercourse  Sex Partners  Men only Men only Men only  # sex partners past 3-6 mos  4-6 4-6 1-3  Sex activity preferences  Insertive and receptive Insertive and receptive;Oral Insertive and receptive;Oral  Condom use  Yes Yes No  % condom use  90 95   Partners genders and ages   28 20-24;M 25-29;M 78-49 M 20-24;M 25-29;M 30-49  Treated for STI?  No Yes N/A  HIV symptoms?  N/A N/A N/A  PrEP Eligibility  CrCl >60 ml/min HIV negative;CrCl >60 ml/min;Substantial risk for HIV Substantial risk for HIV;HIV negative  Paper work received?   No No    Labs:  SCr: Lab Results  Component Value Date   CREATININE 0.89 07/09/2023   CREATININE 0.77 09/27/2021   CREATININE 0.73 (L) 07/13/2020   CREATININE 0.82 02/14/2018   CREATININE 0.82 01/17/2018   HIV Lab Results  Component Value Date   HIV NON-REACTIVE 10/08/2023   HIV NON-REACTIVE 07/09/2023   HIV NON-REACTIVE 04/03/2023   HIV NON-REACTIVE 01/02/2023   HIV NON-REACTIVE 10/08/2022   Hepatitis B Lab Results  Component Value Date   HEPBSAB REACTIVE (A) 04/12/2022   HEPBSAG NEGATIVE 09/07/2015   Hepatitis C Lab Results  Component Value Date   HEPCAB NON-REACTIVE 06/20/2021   Hepatitis A Lab Results  Component Value Date   HAV REACTIVE (A) 01/17/2018   RPR and STI Lab Results  Component Value Date   LABRPR NON-REACTIVE 10/08/2023   LABRPR NON-REACTIVE 07/09/2023   LABRPR NON-REACTIVE 04/03/2023   LABRPR NON-REACTIVE 01/02/2023   LABRPR NON-REACTIVE 10/08/2022    STI Results GC CT   10/08/2023  9:00 AM Negative    Negative    Negative  Negative    Negative    Negative   07/09/2023  9:15 AM Negative    Negative    Negative  Negative    Negative    Negative   04/03/2023  8:46 AM Negative  Negative    Negative  Negative    Negative    Negative   01/02/2023  9:00 AM Negative    Negative    Negative  Negative    Negative    Negative   10/08/2022  9:12 AM Negative    Negative    Negative  Negative    Negative    Negative   07/02/2022  8:54 AM Negative    Negative    Negative  Negative    Negative    Negative   01/03/2022  9:42 AM Negative    Negative    Negative  Negative    Negative    Negative   09/27/2021  8:51 AM Negative    Negative    Negative  Negative    Negative    Negative   06/20/2021  9:14 AM Negative    Negative    Negative  Negative    Negative    Negative   03/22/2021 11:49 AM Negative  Negative   12/13/2020  9:33 AM Negative    Negative    Negative  Negative    Negative    Negative   05/05/2018 12:00 AM Negative    Negative    Negative  Negative    Negative    Negative   02/14/2018 12:00 AM Negative  Negative   01/17/2018 12:00 AM Negative    **POSITIVE**    Negative  Negative    Negative    Negative   10/30/2016 12:00 AM Negative    Negative    Negative  Negative    Negative    Negative   03/08/2016 12:00 AM Negative  Negative   12/06/2015 12:00 AM Negative  Negative     Assessment: Matthias presents to [facility] pharmacy clinic today for PrEP follow up. Continues to take Truvada  Screened for acute HIV symptoms such as fatigue, muscle aches, rash, sore throat, lymphadenopathy, headache, night sweats, nausea/vomiting/diarrhea, and fever.   Plan: - HIV antibody, RPR, oral/rectal/urine cytologies for chlamydia and gonorrhea - Truvada x 3 months if HIV negative - Follow up    Theotis Burrow, PharmD PGY1 Acute Care Pharmacy Resident

## 2023-12-31 ENCOUNTER — Ambulatory Visit: Payer: No Typology Code available for payment source | Admitting: Pharmacist

## 2023-12-31 DIAGNOSIS — Z113 Encounter for screening for infections with a predominantly sexual mode of transmission: Secondary | ICD-10-CM

## 2023-12-31 DIAGNOSIS — Z2981 Encounter for HIV pre-exposure prophylaxis: Secondary | ICD-10-CM

## 2024-01-01 ENCOUNTER — Other Ambulatory Visit: Payer: Self-pay

## 2024-01-03 ENCOUNTER — Other Ambulatory Visit: Payer: Self-pay

## 2024-01-06 ENCOUNTER — Other Ambulatory Visit: Payer: Self-pay

## 2024-01-14 ENCOUNTER — Other Ambulatory Visit (HOSPITAL_COMMUNITY): Payer: Self-pay

## 2024-01-15 ENCOUNTER — Other Ambulatory Visit: Payer: Self-pay

## 2024-01-15 ENCOUNTER — Other Ambulatory Visit (HOSPITAL_BASED_OUTPATIENT_CLINIC_OR_DEPARTMENT_OTHER): Payer: Self-pay

## 2024-01-15 MED ORDER — AMPHETAMINE SULFATE 10 MG PO TABS
ORAL_TABLET | ORAL | 0 refills | Status: DC
  Filled 2024-01-15: qty 90, 30d supply, fill #0

## 2024-01-19 NOTE — Progress Notes (Unsigned)
 HPI: Alexander Solomon is a 38 y.o. male who presents to the [facility] pharmacy clinic for HIV PrEP follow-up.  Insured   [x]    Uninsured  []    Patient Active Problem List   Diagnosis Date Noted   On pre-exposure prophylaxis for HIV 04/03/2023   Prediabetes 07/14/2020   Vitamin D  insufficiency 07/14/2020   DOE (dyspnea on exertion) 03/14/2020   Acute pain of right knee 11/05/2016   Morbid obesity (HCC) 09/07/2015   GERD (gastroesophageal reflux disease) 09/07/2015   Seasonal allergies 09/07/2015   Attention deficit disorder 09/07/2015   Anxiety 09/07/2015   PVC (premature ventricular contraction) 05/04/2015   Benign essential HTN 05/04/2015   Hyperlipidemia 05/04/2015   Obstructive sleep apnea 05/04/2015    Patient's Medications  New Prescriptions   No medications on file  Previous Medications   ACCU-CHEK SOFTCLIX LANCETS LANCETS    Use as directed daily   AMPHETAMINE  SULFATE 10 MG TABS    Take 2 tablets (20 mg total) by mouth in the morning and may take another tablet in the midafternoon as needed as directed.   AMPHETAMINE  SULFATE 10 MG TABS    Take 2 tablets in the morning and may redose another tablet in the midafternoon as needed as directed   CYANOCOBALAMIN  (VITAMIN B12) 1000 MCG/ML INJECTION    Inject 1 mL (1,000 mcg total) into the muscle every 30 (thirty) days.   CYCLOBENZAPRINE (FLEXERIL) 10 MG TABLET    Take 10 mg by mouth 3 (three) times daily as needed for muscle spasms. Reported on 09/07/2015   DICLOFENAC (VOLTAREN) 50 MG EC TABLET    Take 50 mg by mouth 2 (two) times daily as needed.   EMTRICITABINE -TENOFOVIR  (TRUVADA ) 200-300 MG TABLET    Take 1 tablet by mouth daily.   ESCITALOPRAM  (LEXAPRO ) 10 MG TABLET    Take 1 tablet (10 mg total) by mouth daily.   FLUTICASONE  (FLONASE ) 50 MCG/ACT NASAL SPRAY    Place 2 sprays into both nostrils at bedtime.   GLUCOSE BLOOD (ACCU-CHEK GUIDE) TEST STRIP    Use as directed daily   LORATADINE (CLARITIN) 10 MG TABLET    Take 10 mg by  mouth daily.   LOSARTAN -HYDROCHLOROTHIAZIDE  (HYZAAR ) 100-25 MG TABLET    Take 1 tablet by mouth daily.   NEEDLE, DISP, 25 G 25G X 1-1/2" MISC    Use to inject B12 once a month.   OMEPRAZOLE  (PRILOSEC) 40 MG CAPSULE    Take 40 mg by mouth every evening.    OMEPRAZOLE  (PRILOSEC) 40 MG CAPSULE    Take 1 capsule (40 mg total) by mouth every morning 1/2-1 hour before morning meal   SILDENAFIL  (VIAGRA ) 50 MG TABLET    Take 1 tablet by mouth once daily as needed 30 minutes prior to sexual activity.   SIMVASTATIN  (ZOCOR ) 40 MG TABLET    Take 1 tablet (40 mg total) by mouth daily.   SIMVASTATIN  (ZOCOR ) 40 MG TABLET    Take 1 tablet (40 mg total) by mouth every evening.   SUCRALFATE  (CARAFATE ) 1 G TABLET    Take 1 tablet (1 g total) by mouth 2 (two) times daily.   TESTOSTERONE  1.62 % GEL    Apply 2 pumps topically every morning.   TESTOSTERONE  CYPIONATE (DEPOTESTOTERONE CYPIONATE) 100 MG/ML INJECTION    Inject 1 mL (100 mg total) into the muscle every 10 days.   TIRZEPATIDE  (MOUNJARO ) 10 MG/0.5ML PEN    Inject 10 mg into the skin once a week.  TIRZEPATIDE  (MOUNJARO ) 10 MG/0.5ML PEN    Inject 10 mg into the skin once a week.   TIRZEPATIDE  (MOUNJARO ) 10 MG/0.5ML PEN    Inject 10 mg into the skin once a week.   TIRZEPATIDE  (ZEPBOUND ) 2.5 MG/0.5ML PEN    Inject 2.5 mg into the skin once a week.   VITAMIN D , ERGOCALCIFEROL , (DRISDOL ) 1.25 MG (50000 UNIT) CAPS CAPSULE    Take 1 capsule (50,000 Units total) by mouth every 7 (seven) days.   VITAMIN D , ERGOCALCIFEROL , (DRISDOL ) 1.25 MG (50000 UNIT) CAPS CAPSULE    Take 1 capsule (50,000 Units total) by mouth once a week.   VITAMIN D , ERGOCALCIFEROL , (DRISDOL ) 1.25 MG (50000 UNIT) CAPS CAPSULE    Take 1 capsule (50,000 Units total) by mouth once a week.  Modified Medications   No medications on file  Discontinued Medications   No medications on file       05/05/2018   11:42 AM 05/05/2018    9:35 AM 02/14/2018    9:14 AM 01/17/2018    9:07 AM  [facility] HIV PREP  FLOWSHEET RESULTS  Insurance Status  Insured Insured Insured  How did you hear?   Family Physician Referred from Dr Bernetta Brilliant  Gender at birth  Male Male Male  Gender identity  cis-Male cis-Male cis-Male  Risk for HIV >5 partners in past 6 mos (regardless of condom use);Condomless vaginal or anal intercourse  Condomless vaginal or anal intercourse;>5 partners in past 6 mos (regardless of condom use);Hx of STI Condomless vaginal or anal intercourse  Sex Partners  Men only Men only Men only  # sex partners past 3-6 mos  4-6 4-6 1-3  Sex activity preferences  Insertive and receptive Insertive and receptive;Oral Insertive and receptive;Oral  Condom use  Yes Yes No  % condom use  90 95   Partners genders and ages   38 20-24;M 25-29;M 68-49 M 20-24;M 25-29;M 30-49  Treated for STI?  No Yes N/A  HIV symptoms?  N/A N/A N/A  PrEP Eligibility  CrCl >60 ml/min HIV negative;CrCl >60 ml/min;Substantial risk for HIV Substantial risk for HIV;HIV negative  Paper work received?   No No    Labs:  SCr: Lab Results  Component Value Date   CREATININE 0.89 07/09/2023   CREATININE 0.77 09/27/2021   CREATININE 0.73 (L) 07/13/2020   CREATININE 0.82 02/14/2018   CREATININE 0.82 01/17/2018   HIV Lab Results  Component Value Date   HIV NON-REACTIVE 10/08/2023   HIV NON-REACTIVE 07/09/2023   HIV NON-REACTIVE 04/03/2023   HIV NON-REACTIVE 01/02/2023   HIV NON-REACTIVE 10/08/2022   Hepatitis B Lab Results  Component Value Date   HEPBSAB REACTIVE (A) 04/12/2022   HEPBSAG NEGATIVE 09/07/2015   Hepatitis C Lab Results  Component Value Date   HEPCAB NON-REACTIVE 06/20/2021   Hepatitis A Lab Results  Component Value Date   HAV REACTIVE (A) 01/17/2018   RPR and STI Lab Results  Component Value Date   LABRPR NON-REACTIVE 10/08/2023   LABRPR NON-REACTIVE 07/09/2023   LABRPR NON-REACTIVE 04/03/2023   LABRPR NON-REACTIVE 01/02/2023   LABRPR NON-REACTIVE 10/08/2022    STI Results GC CT  10/08/2023   9:00 AM Negative    Negative    Negative  Negative    Negative    Negative   07/09/2023  9:15 AM Negative    Negative    Negative  Negative    Negative    Negative   04/03/2023  8:46 AM Negative    Negative  Negative  Negative    Negative    Negative   01/02/2023  9:00 AM Negative    Negative    Negative  Negative    Negative    Negative   10/08/2022  9:12 AM Negative    Negative    Negative  Negative    Negative    Negative   07/02/2022  8:54 AM Negative    Negative    Negative  Negative    Negative    Negative   01/03/2022  9:42 AM Negative    Negative    Negative  Negative    Negative    Negative   09/27/2021  8:51 AM Negative    Negative    Negative  Negative    Negative    Negative   06/20/2021  9:14 AM Negative    Negative    Negative  Negative    Negative    Negative   03/22/2021 11:49 AM Negative  Negative   12/13/2020  9:33 AM Negative    Negative    Negative  Negative    Negative    Negative   05/05/2018 12:00 AM Negative    Negative    Negative  Negative    Negative    Negative   02/14/2018 12:00 AM Negative  Negative   01/17/2018 12:00 AM Negative    **POSITIVE**    Negative  Negative    Negative    Negative   10/30/2016 12:00 AM Negative    Negative    Negative  Negative    Negative    Negative   03/08/2016 12:00 AM Negative  Negative   12/06/2015 12:00 AM Negative  Negative     Assessment: Brayton is a 38 yo male presenting for HIV PrEP follow-up. Remains on Truvada  with no reported side effects. Reports he's been out of medication for a week and a half due to missing his last appointment and running out of medications.  Patient reports having unprotected sex last week while not taking Truvada . Screened for signs and symptoms of acute HIV including weakness, fatigue, fever, lymphadenopathy. Patient reports no new symptoms and no symptoms indicative of an STI. Sexual encounter was less than 10 days ago. Discussed that RNA may not  be positive yet if acutely infected. Patient agreed to return next week for repeat RNA to ensure no active infection. Discussed importance of abstaining from sexual activity until test results return. Patient expressed understanding. Lafredrick agrees to full STI screening today.   Plan: - HIV RNA, RPR, oral/rectal/urine cytologies - Repeat HIV RNA on 01/27/2024 to ensure HIV negative - Truvada  x 3 mo if HIV negative x 2 - F/u on 04/20/2024 with Cassie  Thank you for involving pharmacy in the patient's care.   Barbra Boone, PharmD PGY1 Acute Care Pharmacy Resident  01/20/2024 11:39 AM

## 2024-01-20 ENCOUNTER — Ambulatory Visit (INDEPENDENT_AMBULATORY_CARE_PROVIDER_SITE_OTHER): Admitting: Pharmacist

## 2024-01-20 ENCOUNTER — Other Ambulatory Visit: Payer: Self-pay

## 2024-01-20 ENCOUNTER — Other Ambulatory Visit (HOSPITAL_COMMUNITY)
Admission: RE | Admit: 2024-01-20 | Discharge: 2024-01-20 | Disposition: A | Discharge status: Home / Self Care | Source: Ambulatory Visit | Attending: Infectious Disease | Admitting: Infectious Disease

## 2024-01-20 DIAGNOSIS — Z79899 Other long term (current) drug therapy: Secondary | ICD-10-CM

## 2024-01-20 DIAGNOSIS — Z113 Encounter for screening for infections with a predominantly sexual mode of transmission: Secondary | ICD-10-CM | POA: Insufficient documentation

## 2024-01-21 ENCOUNTER — Other Ambulatory Visit: Payer: Self-pay

## 2024-01-21 LAB — CYTOLOGY, (ORAL, ANAL, URETHRAL) ANCILLARY ONLY
Chlamydia: NEGATIVE
Chlamydia: NEGATIVE
Comment: NEGATIVE
Comment: NEGATIVE
Comment: NORMAL
Comment: NORMAL
Neisseria Gonorrhea: NEGATIVE
Neisseria Gonorrhea: NEGATIVE

## 2024-01-21 LAB — URINE CYTOLOGY ANCILLARY ONLY
Chlamydia: NEGATIVE
Comment: NEGATIVE
Comment: NORMAL
Neisseria Gonorrhea: NEGATIVE

## 2024-01-22 ENCOUNTER — Other Ambulatory Visit (HOSPITAL_COMMUNITY): Payer: Self-pay

## 2024-01-22 ENCOUNTER — Encounter (HOSPITAL_COMMUNITY): Payer: Self-pay

## 2024-01-22 ENCOUNTER — Other Ambulatory Visit: Payer: Self-pay | Admitting: Pharmacist

## 2024-01-22 LAB — RPR: RPR Ser Ql: NONREACTIVE

## 2024-01-22 LAB — HIV-1 RNA QUANT-NO REFLEX-BLD
HIV 1 RNA Quant: NOT DETECTED {copies}/mL
HIV-1 RNA Quant, Log: NOT DETECTED {Log_copies}/mL

## 2024-01-22 NOTE — Progress Notes (Signed)
 Specialty Pharmacy Ongoing Clinical Assessment Note  Alexander Solomon is a 38 y.o. male who is being followed by the specialty pharmacy service for RxSp HIV PrEP   Patient's specialty medication(s) reviewed today: No data recorded  Missed doses in the last 4 weeks: 10   Patient/Caregiver did not have any additional questions or concerns.   Therapeutic benefit summary: Patient is achieving benefit   Adverse events/side effects summary: No adverse events/side effects   Patient's therapy is appropriate to: Continue    Goals Addressed             This Visit's Progress    Comply with lab assessments   On track    Patient is on track. Patient will adhere to provider and/or lab appointments.      Improve or maintain quality of life   On track    Patient is on track. Patient will be monitored by provider to determine if a change in treatment plan is warranted.      Maintain optimal adherence to therapy   Improving    Patient is on track. Patient will maintain adherence.         Follow up:  6 months  Sonya Duster Specialty Pharmacist

## 2024-01-23 ENCOUNTER — Other Ambulatory Visit: Payer: Self-pay

## 2024-01-24 ENCOUNTER — Other Ambulatory Visit (HOSPITAL_COMMUNITY): Payer: Self-pay

## 2024-01-27 ENCOUNTER — Other Ambulatory Visit: Payer: Self-pay

## 2024-01-27 ENCOUNTER — Other Ambulatory Visit

## 2024-01-27 DIAGNOSIS — Z79899 Other long term (current) drug therapy: Secondary | ICD-10-CM

## 2024-01-28 ENCOUNTER — Other Ambulatory Visit (HOSPITAL_BASED_OUTPATIENT_CLINIC_OR_DEPARTMENT_OTHER): Payer: Self-pay

## 2024-01-29 ENCOUNTER — Other Ambulatory Visit: Payer: Self-pay

## 2024-01-29 LAB — HIV-1 RNA QUANT-NO REFLEX-BLD
HIV 1 RNA Quant: NOT DETECTED {copies}/mL
HIV-1 RNA Quant, Log: NOT DETECTED {Log_copies}/mL

## 2024-01-30 ENCOUNTER — Other Ambulatory Visit (HOSPITAL_COMMUNITY): Payer: Self-pay

## 2024-01-30 ENCOUNTER — Other Ambulatory Visit: Payer: Self-pay

## 2024-01-30 ENCOUNTER — Other Ambulatory Visit: Payer: Self-pay | Admitting: Pharmacist

## 2024-01-30 ENCOUNTER — Encounter: Payer: Self-pay | Admitting: Pharmacist

## 2024-01-30 DIAGNOSIS — Z79899 Other long term (current) drug therapy: Secondary | ICD-10-CM

## 2024-01-30 MED ORDER — EMTRICITABINE-TENOFOVIR DF 200-300 MG PO TABS
1.0000 | ORAL_TABLET | Freq: Every day | ORAL | 2 refills | Status: DC
  Filled 2024-01-30: qty 30, 30d supply, fill #0
  Filled 2024-02-24: qty 30, 30d supply, fill #1
  Filled 2024-03-25: qty 30, 30d supply, fill #2

## 2024-01-30 NOTE — Progress Notes (Signed)
 Specialty Pharmacy Refill Coordination Note  Alexander Solomon is a 38 y.o. male contacted today regarding refills of specialty medication(s) Emtricitabine -Tenofovir  DF (TRUVADA )   Patient requested [name] at [facility] [place] date: 01/31/24   Medication will be filled on 01/31/24.

## 2024-01-31 ENCOUNTER — Other Ambulatory Visit: Payer: Self-pay

## 2024-01-31 ENCOUNTER — Other Ambulatory Visit (HOSPITAL_BASED_OUTPATIENT_CLINIC_OR_DEPARTMENT_OTHER): Payer: Self-pay

## 2024-02-13 ENCOUNTER — Other Ambulatory Visit (HOSPITAL_COMMUNITY): Payer: Self-pay

## 2024-02-21 ENCOUNTER — Other Ambulatory Visit: Payer: Self-pay

## 2024-02-22 ENCOUNTER — Other Ambulatory Visit (HOSPITAL_BASED_OUTPATIENT_CLINIC_OR_DEPARTMENT_OTHER): Payer: Self-pay

## 2024-02-24 ENCOUNTER — Other Ambulatory Visit: Payer: Self-pay

## 2024-02-24 ENCOUNTER — Other Ambulatory Visit (HOSPITAL_BASED_OUTPATIENT_CLINIC_OR_DEPARTMENT_OTHER): Payer: Self-pay

## 2024-02-24 ENCOUNTER — Encounter (HOSPITAL_BASED_OUTPATIENT_CLINIC_OR_DEPARTMENT_OTHER): Payer: Self-pay

## 2024-02-24 MED ORDER — AMPHETAMINE SULFATE 10 MG PO TABS
30.0000 mg | ORAL_TABLET | Freq: Every day | ORAL | 0 refills | Status: AC
  Filled 2024-02-24: qty 90, 30d supply, fill #0

## 2024-02-24 NOTE — Progress Notes (Signed)
 Specialty Pharmacy Refill Coordination Note  Alexander Solomon is a 38 y.o. male contacted today regarding refills of specialty medication(s) Emtricitabine -Tenofovir  DF (TRUVADA )   Patient requested Delivery   Delivery date: 02/27/24   Verified address: [address] [name], [place] [address]   Medication will be filled on 06.04.25.

## 2024-02-25 ENCOUNTER — Other Ambulatory Visit (HOSPITAL_BASED_OUTPATIENT_CLINIC_OR_DEPARTMENT_OTHER): Payer: Self-pay

## 2024-02-26 ENCOUNTER — Other Ambulatory Visit (HOSPITAL_BASED_OUTPATIENT_CLINIC_OR_DEPARTMENT_OTHER): Payer: Self-pay

## 2024-02-26 MED ORDER — LOSARTAN POTASSIUM-HCTZ 100-25 MG PO TABS
1.0000 | ORAL_TABLET | Freq: Every day | ORAL | 3 refills | Status: AC
  Filled 2024-02-26: qty 90, 90d supply, fill #0
  Filled 2024-04-14 – 2024-05-27 (×2): qty 90, 90d supply, fill #1
  Filled 2024-08-26: qty 90, 90d supply, fill #2

## 2024-03-23 ENCOUNTER — Other Ambulatory Visit (HOSPITAL_BASED_OUTPATIENT_CLINIC_OR_DEPARTMENT_OTHER): Payer: Self-pay

## 2024-03-23 MED ORDER — TESTOSTERONE CYPIONATE 100 MG/ML IM SOLN
150.0000 mg | INTRAMUSCULAR | 1 refills | Status: AC
  Filled 2024-04-08: qty 10, 70d supply, fill #0

## 2024-03-25 ENCOUNTER — Other Ambulatory Visit: Payer: Self-pay

## 2024-03-25 ENCOUNTER — Encounter (INDEPENDENT_AMBULATORY_CARE_PROVIDER_SITE_OTHER): Payer: Self-pay

## 2024-03-25 NOTE — Progress Notes (Signed)
 Specialty Pharmacy Refill Coordination Note  Alexander Solomon is a 38 y.o. male contacted today regarding refills of specialty medication(s) Emtricitabine -Tenofovir  DF (TRUVADA )   Patient requested (Patient-Rptd) Delivery   Delivery date: 03/26/24   Verified address: (Patient-Rptd) [address] [place], [place] [address]   Medication will be filled on 03/25/24.

## 2024-04-09 ENCOUNTER — Other Ambulatory Visit (HOSPITAL_BASED_OUTPATIENT_CLINIC_OR_DEPARTMENT_OTHER): Payer: Self-pay

## 2024-04-14 ENCOUNTER — Other Ambulatory Visit: Payer: Self-pay

## 2024-04-14 ENCOUNTER — Other Ambulatory Visit (HOSPITAL_BASED_OUTPATIENT_CLINIC_OR_DEPARTMENT_OTHER): Payer: Self-pay

## 2024-04-15 ENCOUNTER — Other Ambulatory Visit (HOSPITAL_BASED_OUTPATIENT_CLINIC_OR_DEPARTMENT_OTHER): Payer: Self-pay

## 2024-04-15 MED ORDER — AMPHETAMINE SULFATE 10 MG PO TABS
30.0000 mg | ORAL_TABLET | Freq: Every day | ORAL | 0 refills | Status: AC | PRN
  Filled 2024-04-15: qty 90, 30d supply, fill #0

## 2024-04-15 NOTE — Progress Notes (Signed)
 HPI: Alexander Solomon is a 38 y.o. male who presents to the [facility] pharmacy clinic for HIV PrEP follow-up.  Insured   [x]    Uninsured  []    Patient Active Problem List   Diagnosis Date Noted   On pre-exposure prophylaxis for HIV 04/03/2023   Prediabetes 07/14/2020   Vitamin D  insufficiency 07/14/2020   DOE (dyspnea on exertion) 03/14/2020   Acute pain of right knee 11/05/2016   Morbid obesity (HCC) 09/07/2015   GERD (gastroesophageal reflux disease) 09/07/2015   Seasonal allergies 09/07/2015   Attention deficit disorder 09/07/2015   Anxiety 09/07/2015   PVC (premature ventricular contraction) 05/04/2015   Benign essential HTN 05/04/2015   Hyperlipidemia 05/04/2015   Obstructive sleep apnea 05/04/2015    Patient's Medications  New Prescriptions   No medications on file  Previous Medications   ACCU-CHEK SOFTCLIX LANCETS LANCETS    Use as directed daily   AMPHETAMINE  SULFATE 10 MG TABS    Take 2 tablets (20 mg total) by mouth in the morning and may take another tablet in the midafternoon as needed as directed.   AMPHETAMINE  SULFATE 10 MG TABS    Take 2 tablets by mouth in the morning. May redose another tablet in the midafternoon as needed.   AMPHETAMINE  SULFATE 10 MG TABS    Take 2 tablets by mouth in the morning. May redose another tablet in the midafternoon as needed.   CYANOCOBALAMIN  (VITAMIN B12) 1000 MCG/ML INJECTION    Inject 1 mL (1,000 mcg total) into the muscle every 30 (thirty) days.   CYCLOBENZAPRINE (FLEXERIL) 10 MG TABLET    Take 10 mg by mouth 3 (three) times daily as needed for muscle spasms. Reported on 09/07/2015   DICLOFENAC (VOLTAREN) 50 MG EC TABLET    Take 50 mg by mouth 2 (two) times daily as needed.   EMTRICITABINE -TENOFOVIR  (TRUVADA ) 200-300 MG TABLET    Take 1 tablet by mouth daily.   ESCITALOPRAM  (LEXAPRO ) 10 MG TABLET    Take 1 tablet (10 mg total) by mouth daily.   FLUTICASONE  (FLONASE ) 50 MCG/ACT NASAL SPRAY    Place 2 sprays into both nostrils at bedtime.    GLUCOSE BLOOD (ACCU-CHEK GUIDE) TEST STRIP    Use as directed daily   LORATADINE (CLARITIN) 10 MG TABLET    Take 10 mg by mouth daily.   LOSARTAN -HYDROCHLOROTHIAZIDE  (HYZAAR ) 100-25 MG TABLET    Take 1 tablet by mouth daily.   NEEDLE, DISP, 25 G 25G X 1-1/2 MISC    Use to inject B12 once a month.   OMEPRAZOLE  (PRILOSEC) 40 MG CAPSULE    Take 40 mg by mouth every evening.    OMEPRAZOLE  (PRILOSEC) 40 MG CAPSULE    Take 1 capsule (40 mg total) by mouth every morning 1/2-1 hour before morning meal   SILDENAFIL  (VIAGRA ) 50 MG TABLET    Take 1 tablet by mouth once daily as needed 30 minutes prior to sexual activity.   SIMVASTATIN  (ZOCOR ) 40 MG TABLET    Take 1 tablet (40 mg total) by mouth daily.   SIMVASTATIN  (ZOCOR ) 40 MG TABLET    Take 1 tablet (40 mg total) by mouth every evening.   SUCRALFATE  (CARAFATE ) 1 G TABLET    Take 1 tablet (1 g total) by mouth 2 (two) times daily.   TESTOSTERONE  1.62 % GEL    Apply 2 pumps topically every morning.   TESTOSTERONE  CYPIONATE (DEPOTESTOTERONE CYPIONATE) 100 MG/ML INJECTION    Inject 1.5 mLs (150 mg total)  into the muscle every 10 days   TIRZEPATIDE  (MOUNJARO ) 10 MG/0.5ML PEN    Inject 10 mg into the skin once a week.   TIRZEPATIDE  (MOUNJARO ) 10 MG/0.5ML PEN    Inject 10 mg into the skin once a week.   TIRZEPATIDE  (MOUNJARO ) 10 MG/0.5ML PEN    Inject 10 mg into the skin once a week.   TIRZEPATIDE  (ZEPBOUND ) 2.5 MG/0.5ML PEN    Inject 2.5 mg into the skin once a week.   VITAMIN D , ERGOCALCIFEROL , (DRISDOL ) 1.25 MG (50000 UNIT) CAPS CAPSULE    Take 1 capsule (50,000 Units total) by mouth every 7 (seven) days.   VITAMIN D , ERGOCALCIFEROL , (DRISDOL ) 1.25 MG (50000 UNIT) CAPS CAPSULE    Take 1 capsule (50,000 Units total) by mouth once a week.   VITAMIN D , ERGOCALCIFEROL , (DRISDOL ) 1.25 MG (50000 UNIT) CAPS CAPSULE    Take 1 capsule (50,000 Units total) by mouth once a week.  Modified Medications   No medications on file  Discontinued Medications   No  medications on file       05/05/2018   11:42 AM 05/05/2018    9:35 AM 02/14/2018    9:14 AM 01/17/2018    9:07 AM  [facility] HIV PREP FLOWSHEET RESULTS  Insurance Status  Insured Insured Insured  How did you hear?   Family Physician Referred from Dr Loreli  Gender at birth  Male Male Male  Gender identity  cis-Male  cis-Male  cis-Male   Risk for HIV >5 partners in past 6 mos (regardless of condom use);Condomless vaginal or anal intercourse   Condomless vaginal or anal intercourse;>5 partners in past 6 mos (regardless of condom use);Hx of STI  Condomless vaginal or anal intercourse   Sex Partners  Men only Men only Men only  # sex partners past 3-6 mos  4-6  4-6  1-3   Sex activity preferences  Insertive and receptive Insertive and receptive;Oral Insertive and receptive;Oral  Condom use  Yes Yes No  % condom use  90 95   Partners genders and ages   36 20-24;M 25-29;M 79-49 M 20-24;M 25-29;M 30-49  Treated for STI?  No Yes N/A  HIV symptoms?  N/A  N/A  N/A   PrEP Eligibility  CrCl >60 ml/min  HIV negative;CrCl >60 ml/min;Substantial risk for HIV  Substantial risk for HIV;HIV negative   Paper work received?   No No     Data saved with a previous flowsheet row definition    Labs:  SCr: Lab Results  Component Value Date   CREATININE 0.89 07/09/2023   CREATININE 0.77 09/27/2021   CREATININE 0.73 (L) 07/13/2020   CREATININE 0.82 02/14/2018   CREATININE 0.82 01/17/2018   HIV Lab Results  Component Value Date   HIV NON-REACTIVE 10/08/2023   HIV NON-REACTIVE 07/09/2023   HIV NON-REACTIVE 04/03/2023   HIV NON-REACTIVE 01/02/2023   HIV NON-REACTIVE 10/08/2022   Hepatitis B Lab Results  Component Value Date   HEPBSAB REACTIVE (A) 04/12/2022   HEPBSAG NEGATIVE 09/07/2015   Hepatitis C Lab Results  Component Value Date   HEPCAB NON-REACTIVE 06/20/2021   Hepatitis A Lab Results  Component Value Date   HAV REACTIVE (A) 01/17/2018   RPR and STI Lab Results  Component Value Date    LABRPR NON-REACTIVE 01/20/2024   LABRPR NON-REACTIVE 10/08/2023   LABRPR NON-REACTIVE 07/09/2023   LABRPR NON-REACTIVE 04/03/2023   LABRPR NON-REACTIVE 01/02/2023    STI Results GC CT  01/20/2024 11:32 AM Negative  Negative    Negative  Negative    Negative    Negative   10/08/2023  9:00 AM Negative    Negative    Negative  Negative    Negative    Negative   07/09/2023  9:15 AM Negative    Negative    Negative  Negative    Negative    Negative   04/03/2023  8:46 AM Negative    Negative    Negative  Negative    Negative    Negative   01/02/2023  9:00 AM Negative    Negative    Negative  Negative    Negative    Negative   10/08/2022  9:12 AM Negative    Negative    Negative  Negative    Negative    Negative   07/02/2022  8:54 AM Negative    Negative    Negative  Negative    Negative    Negative   01/03/2022  9:42 AM Negative    Negative    Negative  Negative    Negative    Negative   09/27/2021  8:51 AM Negative    Negative    Negative  Negative    Negative    Negative   06/20/2021  9:14 AM Negative    Negative    Negative  Negative    Negative    Negative   03/22/2021 11:49 AM Negative  Negative   12/13/2020  9:33 AM Negative    Negative    Negative  Negative    Negative    Negative   05/05/2018 12:00 AM Negative    Negative    Negative  Negative    Negative    Negative   02/14/2018 12:00 AM Negative  Negative   01/17/2018 12:00 AM Negative    **POSITIVE**    Negative  Negative    Negative    Negative   10/30/2016 12:00 AM Negative    Negative    Negative  Negative    Negative    Negative   03/08/2016 12:00 AM Negative  Negative   12/06/2015 12:00 AM Negative  Negative     Assessment: [place] presents today for his HIV PrEP follow up. Last HIV ab and STI testing were negative on 01/20/24. He continues to do well on Truvada  without any issues or concerns. Screened for acute HIV symptoms such as fatigue, muscle aches, rash, sore throat,  lymphadenopathy, headache, night sweats, nausea/vomiting/diarrhea, and fever. Denies any symptoms. Last BMP was in October 2024 - next one due at appointment in October.   He was laid off work 2 weeks ago and will lose his insurance on 8/1. Will go ahead and refill his Truvada  today x 90 days so that he can fill before the end of the week. He is tossing around options for another career and will be getting insurance through the market place. No issues today. Will see him back in 3 months.   Plan: - HIV ab, RPR, and urine/rectal/pharyngeal cytologies for GC/chlamydia  - Truvada  x 3 months  - Follow up with me again on 07/14/24  Kavita Bartl L. Myla Mauriello, PharmD, BCIDP, AAHIVP, CPP Clinical Pharmacist Practitioner - Infectious Diseases Clinical Pharmacist Lead - Specialty Pharmacy [facility]

## 2024-04-20 ENCOUNTER — Other Ambulatory Visit: Payer: Self-pay

## 2024-04-20 ENCOUNTER — Other Ambulatory Visit (HOSPITAL_COMMUNITY)
Admission: RE | Admit: 2024-04-20 | Discharge: 2024-04-20 | Disposition: A | Discharge status: Home / Self Care | Source: Ambulatory Visit | Attending: Infectious Disease | Admitting: Infectious Disease

## 2024-04-20 ENCOUNTER — Other Ambulatory Visit (HOSPITAL_COMMUNITY): Payer: Self-pay

## 2024-04-20 ENCOUNTER — Ambulatory Visit (INDEPENDENT_AMBULATORY_CARE_PROVIDER_SITE_OTHER): Admitting: Pharmacist

## 2024-04-20 DIAGNOSIS — Z79899 Other long term (current) drug therapy: Secondary | ICD-10-CM

## 2024-04-20 DIAGNOSIS — Z113 Encounter for screening for infections with a predominantly sexual mode of transmission: Secondary | ICD-10-CM | POA: Diagnosis not present

## 2024-04-20 MED ORDER — EMTRICITABINE-TENOFOVIR DF 200-300 MG PO TABS
1.0000 | ORAL_TABLET | Freq: Every day | ORAL | 0 refills | Status: DC
  Filled 2024-04-20: qty 90, 90d supply, fill #0

## 2024-04-20 NOTE — Progress Notes (Signed)
 Specialty Pharmacy Refill Coordination Note  Alexander Solomon is a 38 y.o. male contacted today regarding refills of specialty medication(s) Emtricitabine -Tenofovir  DF (TRUVADA )   Patient requested Marylyn at [facility] at [place] date: 04/20/24   Medication will be filled on 04/20/24.

## 2024-04-21 ENCOUNTER — Encounter: Payer: Self-pay | Admitting: Pharmacist

## 2024-04-21 LAB — URINE CYTOLOGY ANCILLARY ONLY
Chlamydia: NEGATIVE
Comment: NEGATIVE
Comment: NORMAL
Neisseria Gonorrhea: NEGATIVE

## 2024-04-21 LAB — CYTOLOGY, (ORAL, ANAL, URETHRAL) ANCILLARY ONLY
Chlamydia: NEGATIVE
Chlamydia: POSITIVE — AB
Comment: NEGATIVE
Comment: NEGATIVE
Comment: NORMAL
Comment: NORMAL
Neisseria Gonorrhea: NEGATIVE
Neisseria Gonorrhea: NEGATIVE

## 2024-04-21 LAB — RPR: RPR Ser Ql: NONREACTIVE

## 2024-04-21 LAB — HIV ANTIBODY (ROUTINE TESTING W REFLEX): HIV 1&2 Ab, 4th Generation: NONREACTIVE

## 2024-04-22 ENCOUNTER — Ambulatory Visit: Payer: Self-pay | Admitting: Pharmacist

## 2024-04-22 ENCOUNTER — Other Ambulatory Visit (HOSPITAL_BASED_OUTPATIENT_CLINIC_OR_DEPARTMENT_OTHER): Payer: Self-pay

## 2024-04-22 ENCOUNTER — Other Ambulatory Visit: Payer: Self-pay | Admitting: Pharmacist

## 2024-04-22 DIAGNOSIS — A749 Chlamydial infection, unspecified: Secondary | ICD-10-CM

## 2024-04-22 MED ORDER — DOXYCYCLINE HYCLATE 100 MG PO TABS
100.0000 mg | ORAL_TABLET | Freq: Two times a day (BID) | ORAL | 0 refills | Status: AC
Start: 2024-04-22 — End: 2024-04-29
  Filled 2024-04-22: qty 14, 7d supply, fill #0

## 2024-05-26 ENCOUNTER — Other Ambulatory Visit (HOSPITAL_BASED_OUTPATIENT_CLINIC_OR_DEPARTMENT_OTHER): Payer: Self-pay

## 2024-05-27 ENCOUNTER — Other Ambulatory Visit: Payer: Self-pay

## 2024-05-27 ENCOUNTER — Other Ambulatory Visit (HOSPITAL_BASED_OUTPATIENT_CLINIC_OR_DEPARTMENT_OTHER): Payer: Self-pay

## 2024-05-27 MED ORDER — OMEPRAZOLE 40 MG PO CPDR
40.0000 mg | DELAYED_RELEASE_CAPSULE | Freq: Every day | ORAL | 1 refills | Status: AC
  Filled 2024-05-27: qty 90, 90d supply, fill #0
  Filled 2024-08-26: qty 90, 90d supply, fill #1

## 2024-05-27 MED ORDER — SIMVASTATIN 40 MG PO TABS
40.0000 mg | ORAL_TABLET | Freq: Every evening | ORAL | 1 refills | Status: AC
  Filled 2024-05-27: qty 90, 90d supply, fill #0
  Filled 2024-07-15 – 2024-08-26 (×2): qty 90, 90d supply, fill #1

## 2024-05-28 ENCOUNTER — Other Ambulatory Visit (HOSPITAL_BASED_OUTPATIENT_CLINIC_OR_DEPARTMENT_OTHER): Payer: Self-pay

## 2024-05-28 MED ORDER — AMPHETAMINE SULFATE 10 MG PO TABS
ORAL_TABLET | ORAL | 0 refills | Status: DC
  Filled 2024-05-28 – 2024-06-02 (×2): qty 90, 30d supply, fill #0

## 2024-05-29 ENCOUNTER — Other Ambulatory Visit: Payer: Self-pay

## 2024-06-02 ENCOUNTER — Other Ambulatory Visit: Payer: Self-pay

## 2024-06-02 ENCOUNTER — Other Ambulatory Visit (HOSPITAL_BASED_OUTPATIENT_CLINIC_OR_DEPARTMENT_OTHER): Payer: Self-pay

## 2024-06-03 ENCOUNTER — Other Ambulatory Visit (HOSPITAL_BASED_OUTPATIENT_CLINIC_OR_DEPARTMENT_OTHER): Payer: Self-pay

## 2024-06-03 ENCOUNTER — Encounter (HOSPITAL_BASED_OUTPATIENT_CLINIC_OR_DEPARTMENT_OTHER): Payer: Self-pay

## 2024-06-06 ENCOUNTER — Other Ambulatory Visit (HOSPITAL_BASED_OUTPATIENT_CLINIC_OR_DEPARTMENT_OTHER): Payer: Self-pay

## 2024-06-11 ENCOUNTER — Other Ambulatory Visit: Payer: Self-pay

## 2024-06-24 ENCOUNTER — Other Ambulatory Visit (HOSPITAL_BASED_OUTPATIENT_CLINIC_OR_DEPARTMENT_OTHER): Payer: Self-pay

## 2024-06-24 MED ORDER — TESTOSTERONE CYPIONATE 200 MG/ML IM SOLN
200.0000 mg | INTRAMUSCULAR | 1 refills | Status: AC
  Filled 2024-06-24: qty 3, 30d supply, fill #0
  Filled 2024-07-15: qty 10, 100d supply, fill #1
  Filled 2024-07-25: qty 3, 30d supply, fill #1
  Filled 2024-08-26: qty 3, 30d supply, fill #2
  Filled 2024-09-18: qty 3, 30d supply, fill #3
  Filled 2024-10-15: qty 3, 30d supply, fill #4

## 2024-06-24 MED ORDER — ZEPBOUND 10 MG/0.5ML ~~LOC~~ SOAJ
10.0000 mg | SUBCUTANEOUS | 11 refills | Status: AC
  Filled 2024-06-24: qty 2, 28d supply, fill #0
  Filled 2024-07-15 – 2024-07-21 (×2): qty 2, 28d supply, fill #1
  Filled 2024-08-17: qty 2, 28d supply, fill #2
  Filled 2024-09-13: qty 2, 28d supply, fill #3
  Filled 2024-10-12: qty 2, 28d supply, fill #4

## 2024-06-25 ENCOUNTER — Other Ambulatory Visit (HOSPITAL_BASED_OUTPATIENT_CLINIC_OR_DEPARTMENT_OTHER): Payer: Self-pay

## 2024-07-10 ENCOUNTER — Other Ambulatory Visit: Payer: Self-pay

## 2024-07-13 NOTE — Progress Notes (Deleted)
 HPI: Alexander Solomon is a 38 y.o. male who presents to the [facility] pharmacy clinic for HIV PrEP follow-up.  Referring ID Physician: Dr. Fleeta Rothman   Patient Active Problem List   Diagnosis Date Noted   On pre-exposure prophylaxis for HIV 04/03/2023   Prediabetes 07/14/2020   Vitamin D  insufficiency 07/14/2020   DOE (dyspnea on exertion) 03/14/2020   Acute pain of right knee 11/05/2016   Morbid obesity (HCC) 09/07/2015   GERD (gastroesophageal reflux disease) 09/07/2015   Seasonal allergies 09/07/2015   Attention deficit disorder 09/07/2015   Anxiety 09/07/2015   PVC (premature ventricular contraction) 05/04/2015   Benign essential HTN 05/04/2015   Hyperlipidemia 05/04/2015   Obstructive sleep apnea 05/04/2015    Patient's Medications  New Prescriptions   No medications on file  Previous Medications   ACCU-CHEK SOFTCLIX LANCETS LANCETS    Use as directed daily   AMPHETAMINE  SULFATE 10 MG TABS    Take 2 tablets (20 mg total) by mouth in the morning and may take another tablet in the midafternoon as needed as directed.   AMPHETAMINE  SULFATE 10 MG TABS    Take 2 tablets by mouth in the morning. May redose another tablet in the midafternoon as needed.   AMPHETAMINE  SULFATE 10 MG TABS    Take 2 tablets by mouth in the morning. May redose another tablet in the midafternoon as needed.   AMPHETAMINE  SULFATE 10 MG TABS    Take 2 tablets (20 mg total) by mouth in the morning AND may redose another 1 tablet (10 mg total) in the midafternoon as needed.   CYANOCOBALAMIN  (VITAMIN B12) 1000 MCG/ML INJECTION    Inject 1 mL (1,000 mcg total) into the muscle every 30 (thirty) days.   CYCLOBENZAPRINE (FLEXERIL) 10 MG TABLET    Take 10 mg by mouth 3 (three) times daily as needed for muscle spasms. Reported on 09/07/2015   DICLOFENAC (VOLTAREN) 50 MG EC TABLET    Take 50 mg by mouth 2 (two) times daily as needed.   EMTRICITABINE -TENOFOVIR  (TRUVADA ) 200-300 MG TABLET    Take 1 tablet by mouth daily.    ESCITALOPRAM  (LEXAPRO ) 10 MG TABLET    Take 1 tablet (10 mg total) by mouth daily.   FLUTICASONE  (FLONASE ) 50 MCG/ACT NASAL SPRAY    Place 2 sprays into both nostrils at bedtime.   GLUCOSE BLOOD (ACCU-CHEK GUIDE) TEST STRIP    Use as directed daily   LORATADINE (CLARITIN) 10 MG TABLET    Take 10 mg by mouth daily.   LOSARTAN -HYDROCHLOROTHIAZIDE  (HYZAAR ) 100-25 MG TABLET    Take 1 tablet by mouth daily.   NEEDLE, DISP, 25 G 25G X 1-1/2 MISC    Use to inject B12 once a month.   OMEPRAZOLE  (PRILOSEC) 40 MG CAPSULE    Take 40 mg by mouth every evening.    OMEPRAZOLE  (PRILOSEC) 40 MG CAPSULE    Take 1 capsule (40 mg total) by mouth daily 30 minutes to 1 hour before breakfast.   SILDENAFIL  (VIAGRA ) 50 MG TABLET    Take 1 tablet by mouth once daily as needed 30 minutes prior to sexual activity.   SIMVASTATIN  (ZOCOR ) 40 MG TABLET    Take 1 tablet (40 mg total) by mouth daily.   SIMVASTATIN  (ZOCOR ) 40 MG TABLET    Take 1 tablet (40 mg total) by mouth every evening.   SUCRALFATE  (CARAFATE ) 1 G TABLET    Take 1 tablet (1 g total) by mouth 2 (two) times daily.  TESTOSTERONE  1.62 % GEL    Apply 2 pumps topically every morning.   TESTOSTERONE  CYPIONATE (DEPO-TESTOSTERONE ) 200 MG/ML INJECTION    Inject 1 mL (200 mg total) into the muscle every 10 days.   TESTOSTERONE  CYPIONATE (DEPOTESTOTERONE CYPIONATE) 100 MG/ML INJECTION    Inject 1.5 mLs (150 mg total) into the muscle every 10 days   TIRZEPATIDE  (MOUNJARO ) 10 MG/0.5ML PEN    Inject 10 mg into the skin once a week.   TIRZEPATIDE  (MOUNJARO ) 10 MG/0.5ML PEN    Inject 10 mg into the skin once a week.   TIRZEPATIDE  (MOUNJARO ) 10 MG/0.5ML PEN    Inject 10 mg into the skin once a week.   TIRZEPATIDE  (ZEPBOUND ) 10 MG/0.5ML PEN    Inject 10 mg into the skin once a week for severe sleep apnea.   TIRZEPATIDE  (ZEPBOUND ) 2.5 MG/0.5ML PEN    Inject 2.5 mg into the skin once a week.   VITAMIN D , ERGOCALCIFEROL , (DRISDOL ) 1.25 MG (50000 UNIT) CAPS CAPSULE    Take 1  capsule (50,000 Units total) by mouth every 7 (seven) days.   VITAMIN D , ERGOCALCIFEROL , (DRISDOL ) 1.25 MG (50000 UNIT) CAPS CAPSULE    Take 1 capsule (50,000 Units total) by mouth once a week.   VITAMIN D , ERGOCALCIFEROL , (DRISDOL ) 1.25 MG (50000 UNIT) CAPS CAPSULE    Take 1 capsule (50,000 Units total) by mouth once a week.  Modified Medications   No medications on file  Discontinued Medications   No medications on file       05/05/2018   11:42 AM 05/05/2018    9:35 AM 02/14/2018    9:14 AM 01/17/2018    9:07 AM  [facility] HIV PREP FLOWSHEET RESULTS  Insurance Status  Insured Insured Insured  How did you hear?   Family Physician Referred from Dr Loreli  Gender at birth  Male Male Male  Gender identity  cis-Male  cis-Male  cis-Male   Risk for HIV >5 partners in past 6 mos (regardless of condom use);Condomless vaginal or anal intercourse   Condomless vaginal or anal intercourse;>5 partners in past 6 mos (regardless of condom use);Hx of STI  Condomless vaginal or anal intercourse   Sex Partners  Men only Men only Men only  # sex partners past 3-6 mos  4-6  4-6  1-3   Sex activity preferences  Insertive and receptive Insertive and receptive;Oral Insertive and receptive;Oral  Condom use  Yes Yes No  % condom use  90 95   Partners genders and ages   22 20-24;M 25-29;M 56-49 M 20-24;M 25-29;M 30-49  Treated for STI?  No Yes N/A  HIV symptoms?  N/A  N/A  N/A   PrEP Eligibility  CrCl >60 ml/min  HIV negative;CrCl >60 ml/min;Substantial risk for HIV  Substantial risk for HIV;HIV negative   Paper work received?   No No     Data saved with a previous flowsheet row definition    Labs:  SCr: Lab Results  Component Value Date   CREATININE 0.89 07/09/2023   CREATININE 0.77 09/27/2021   CREATININE 0.73 (L) 07/13/2020   CREATININE 0.82 02/14/2018   CREATININE 0.82 01/17/2018   HIV Lab Results  Component Value Date   HIV NON-REACTIVE 04/20/2024   HIV NON-REACTIVE 10/08/2023   HIV NON-REACTIVE  07/09/2023   HIV NON-REACTIVE 04/03/2023   HIV NON-REACTIVE 01/02/2023   Hepatitis B Lab Results  Component Value Date   HEPBSAB REACTIVE (A) 04/12/2022   HEPBSAG NEGATIVE 09/07/2015   Hepatitis C Lab  Results  Component Value Date   HEPCAB NON-REACTIVE 06/20/2021   Hepatitis A Lab Results  Component Value Date   HAV REACTIVE (A) 01/17/2018   RPR and STI Lab Results  Component Value Date   LABRPR NON-REACTIVE 04/20/2024   LABRPR NON-REACTIVE 01/20/2024   LABRPR NON-REACTIVE 10/08/2023   LABRPR NON-REACTIVE 07/09/2023   LABRPR NON-REACTIVE 04/03/2023    STI Results GC CT  04/20/2024 10:42 AM Negative    Negative    Negative  Positive    Negative    Negative   01/20/2024 11:32 AM Negative    Negative    Negative  Negative    Negative    Negative   10/08/2023  9:00 AM Negative    Negative    Negative  Negative    Negative    Negative   07/09/2023  9:15 AM Negative    Negative    Negative  Negative    Negative    Negative   04/03/2023  8:46 AM Negative    Negative    Negative  Negative    Negative    Negative   01/02/2023  9:00 AM Negative    Negative    Negative  Negative    Negative    Negative   10/08/2022  9:12 AM Negative    Negative    Negative  Negative    Negative    Negative   07/02/2022  8:54 AM Negative    Negative    Negative  Negative    Negative    Negative   01/03/2022  9:42 AM Negative    Negative    Negative  Negative    Negative    Negative   09/27/2021  8:51 AM Negative    Negative    Negative  Negative    Negative    Negative   06/20/2021  9:14 AM Negative    Negative    Negative  Negative    Negative    Negative   03/22/2021 11:49 AM Negative  Negative   12/13/2020  9:33 AM Negative    Negative    Negative  Negative    Negative    Negative   05/05/2018 12:00 AM Negative    Negative    Negative  Negative    Negative    Negative   02/14/2018 12:00 AM Negative  Negative   01/17/2018 12:00 AM Negative     **POSITIVE**    Negative  Negative    Negative    Negative   10/30/2016 12:00 AM Negative    Negative    Negative  Negative    Negative    Negative   03/08/2016 12:00 AM Negative  Negative   12/06/2015 12:00 AM Negative  Negative     Assessment: Devon presents today to follow up for HIV PrEP. Continues to do well on Truvada  without issues. Screened for acute HIV symptoms such as fatigue, muscle aches, rash, sore throat, lymphadenopathy, headache, night sweats, nausea/vomiting/diarrhea, and fever. Denies any symptoms.   Labs: Last HIV ab was negative on 04/20/24; last BMP was October 2024, due today.  Eligible vaccinations: Declines vaccines  Plan: - HIV ab, CMP, lipid panel  - Truvada  x 3 months if HIV negative - Follow up again on ***  Dian Laprade L. Raymona Boss, PharmD, BCIDP, AAHIVP, CPP Clinical Pharmacist Practitioner - Infectious Diseases Clinical Pharmacist Lead - Specialty Pharmacy [facility] for Infectious Disease

## 2024-07-14 ENCOUNTER — Other Ambulatory Visit (HOSPITAL_COMMUNITY): Payer: Self-pay

## 2024-07-14 ENCOUNTER — Ambulatory Visit: Admitting: Pharmacist

## 2024-07-14 DIAGNOSIS — Z113 Encounter for screening for infections with a predominantly sexual mode of transmission: Secondary | ICD-10-CM

## 2024-07-14 DIAGNOSIS — Z79899 Other long term (current) drug therapy: Secondary | ICD-10-CM

## 2024-07-15 ENCOUNTER — Other Ambulatory Visit (HOSPITAL_BASED_OUTPATIENT_CLINIC_OR_DEPARTMENT_OTHER): Payer: Self-pay

## 2024-07-15 ENCOUNTER — Other Ambulatory Visit (HOSPITAL_COMMUNITY): Payer: Self-pay

## 2024-07-16 ENCOUNTER — Other Ambulatory Visit (HOSPITAL_BASED_OUTPATIENT_CLINIC_OR_DEPARTMENT_OTHER): Payer: Self-pay

## 2024-07-16 ENCOUNTER — Other Ambulatory Visit: Payer: Self-pay

## 2024-07-16 MED ORDER — AMPHETAMINE SULFATE 10 MG PO TABS
20.0000 mg | ORAL_TABLET | Freq: Every morning | ORAL | 0 refills | Status: AC
  Filled 2024-07-16: qty 90, 30d supply, fill #0

## 2024-07-20 ENCOUNTER — Other Ambulatory Visit: Payer: Self-pay

## 2024-07-21 ENCOUNTER — Other Ambulatory Visit (HOSPITAL_BASED_OUTPATIENT_CLINIC_OR_DEPARTMENT_OTHER): Payer: Self-pay

## 2024-07-22 ENCOUNTER — Other Ambulatory Visit (HOSPITAL_COMMUNITY): Payer: Self-pay

## 2024-07-22 ENCOUNTER — Other Ambulatory Visit (HOSPITAL_BASED_OUTPATIENT_CLINIC_OR_DEPARTMENT_OTHER): Payer: Self-pay

## 2024-07-23 ENCOUNTER — Other Ambulatory Visit (HOSPITAL_BASED_OUTPATIENT_CLINIC_OR_DEPARTMENT_OTHER): Payer: Self-pay

## 2024-07-25 ENCOUNTER — Other Ambulatory Visit (HOSPITAL_BASED_OUTPATIENT_CLINIC_OR_DEPARTMENT_OTHER): Payer: Self-pay

## 2024-07-27 ENCOUNTER — Other Ambulatory Visit: Payer: Self-pay

## 2024-08-18 ENCOUNTER — Other Ambulatory Visit (HOSPITAL_BASED_OUTPATIENT_CLINIC_OR_DEPARTMENT_OTHER): Payer: Self-pay

## 2024-08-24 ENCOUNTER — Other Ambulatory Visit: Payer: Self-pay

## 2024-08-24 ENCOUNTER — Other Ambulatory Visit (HOSPITAL_BASED_OUTPATIENT_CLINIC_OR_DEPARTMENT_OTHER): Payer: Self-pay

## 2024-08-24 MED ORDER — AMPHETAMINE SULFATE 10 MG PO TABS
ORAL_TABLET | ORAL | 0 refills | Status: AC
  Filled 2024-08-24: qty 90, 30d supply, fill #0

## 2024-08-26 ENCOUNTER — Other Ambulatory Visit (HOSPITAL_BASED_OUTPATIENT_CLINIC_OR_DEPARTMENT_OTHER): Payer: Self-pay

## 2024-08-26 ENCOUNTER — Other Ambulatory Visit: Payer: Self-pay

## 2024-08-27 ENCOUNTER — Other Ambulatory Visit: Payer: Self-pay

## 2024-08-29 ENCOUNTER — Other Ambulatory Visit (HOSPITAL_BASED_OUTPATIENT_CLINIC_OR_DEPARTMENT_OTHER): Payer: Self-pay

## 2024-09-14 ENCOUNTER — Other Ambulatory Visit (HOSPITAL_BASED_OUTPATIENT_CLINIC_OR_DEPARTMENT_OTHER): Payer: Self-pay

## 2024-09-18 ENCOUNTER — Other Ambulatory Visit (HOSPITAL_BASED_OUTPATIENT_CLINIC_OR_DEPARTMENT_OTHER): Payer: Self-pay

## 2024-09-22 ENCOUNTER — Other Ambulatory Visit: Payer: Self-pay

## 2024-09-22 ENCOUNTER — Other Ambulatory Visit (HOSPITAL_COMMUNITY): Payer: Self-pay

## 2024-09-22 ENCOUNTER — Ambulatory Visit (INDEPENDENT_AMBULATORY_CARE_PROVIDER_SITE_OTHER): Admitting: Pharmacist

## 2024-09-22 ENCOUNTER — Other Ambulatory Visit (HOSPITAL_COMMUNITY)
Admission: RE | Admit: 2024-09-22 | Discharge: 2024-09-22 | Disposition: A | Discharge status: Home / Self Care | Source: Ambulatory Visit | Attending: Infectious Disease | Admitting: Infectious Disease

## 2024-09-22 DIAGNOSIS — Z206 Contact with and (suspected) exposure to human immunodeficiency virus [HIV]: Secondary | ICD-10-CM

## 2024-09-22 DIAGNOSIS — Z79899 Other long term (current) drug therapy: Secondary | ICD-10-CM | POA: Diagnosis not present

## 2024-09-22 DIAGNOSIS — Z113 Encounter for screening for infections with a predominantly sexual mode of transmission: Secondary | ICD-10-CM

## 2024-09-22 NOTE — Progress Notes (Signed)
 "  HPI: Alexander Solomon is a 38 y.o. male who presents to the [facility] pharmacy clinic for HIV PrEP follow-up.  Referring ID Physician: Dr. Fleeta Rothman   Patient Active Problem List   Diagnosis Date Noted   On pre-exposure prophylaxis for HIV 04/03/2023   Prediabetes 07/14/2020   Vitamin D  insufficiency 07/14/2020   DOE (dyspnea on exertion) 03/14/2020   Acute pain of right knee 11/05/2016   Morbid obesity (HCC) 09/07/2015   GERD (gastroesophageal reflux disease) 09/07/2015   Seasonal allergies 09/07/2015   Attention deficit disorder 09/07/2015   Anxiety 09/07/2015   PVC (premature ventricular contraction) 05/04/2015   Benign essential HTN 05/04/2015   Hyperlipidemia 05/04/2015   Obstructive sleep apnea 05/04/2015    Patient's Medications  New Prescriptions   No medications on file  Previous Medications   ACCU-CHEK SOFTCLIX LANCETS LANCETS    Use as directed daily   AMPHETAMINE  SULFATE 10 MG TABS    Take 2 tablets (20 mg total) by mouth in the morning and may take another tablet in the midafternoon as needed as directed.   AMPHETAMINE  SULFATE 10 MG TABS    Take 2 tablets by mouth in the morning. May redose another tablet in the midafternoon as needed.   AMPHETAMINE  SULFATE 10 MG TABS    Take 2 tablets by mouth in the morning. May redose another tablet in the midafternoon as needed.   AMPHETAMINE  SULFATE 10 MG TABS    Take 2 tablets (20 mg total) by mouth in the morning. May redose with another tablet midafternoon as needed.   AMPHETAMINE  SULFATE 10 MG TABS    Take 2 tablets (20 mg total) by mouth in the morning. May take another tablet  midafternon as needed.   CYANOCOBALAMIN  (VITAMIN B12) 1000 MCG/ML INJECTION    Inject 1 mL (1,000 mcg total) into the muscle every 30 (thirty) days.   CYCLOBENZAPRINE (FLEXERIL) 10 MG TABLET    Take 10 mg by mouth 3 (three) times daily as needed for muscle spasms. Reported on 09/07/2015   DICLOFENAC (VOLTAREN) 50 MG EC TABLET    Take 50 mg by mouth 2 (two)  times daily as needed.   EMTRICITABINE -TENOFOVIR  (TRUVADA ) 200-300 MG TABLET    Take 1 tablet by mouth daily.   ESCITALOPRAM  (LEXAPRO ) 10 MG TABLET    Take 1 tablet (10 mg total) by mouth daily.   FLUTICASONE  (FLONASE ) 50 MCG/ACT NASAL SPRAY    Place 2 sprays into both nostrils at bedtime.   GLUCOSE BLOOD (ACCU-CHEK GUIDE) TEST STRIP    Use as directed daily   LORATADINE (CLARITIN) 10 MG TABLET    Take 10 mg by mouth daily.   LOSARTAN -HYDROCHLOROTHIAZIDE  (HYZAAR ) 100-25 MG TABLET    Take 1 tablet by mouth daily.   NEEDLE, DISP, 25 G 25G X 1-1/2 MISC    Use to inject B12 once a month.   OMEPRAZOLE  (PRILOSEC) 40 MG CAPSULE    Take 40 mg by mouth every evening.    OMEPRAZOLE  (PRILOSEC) 40 MG CAPSULE    Take 1 capsule (40 mg total) by mouth daily 30 minutes to 1 hour before breakfast.   SILDENAFIL  (VIAGRA ) 50 MG TABLET    Take 1 tablet by mouth once daily as needed 30 minutes prior to sexual activity.   SIMVASTATIN  (ZOCOR ) 40 MG TABLET    Take 1 tablet (40 mg total) by mouth daily.   SIMVASTATIN  (ZOCOR ) 40 MG TABLET    Take 1 tablet (40 mg total) by mouth  every evening.   SUCRALFATE  (CARAFATE ) 1 G TABLET    Take 1 tablet (1 g total) by mouth 2 (two) times daily.   TESTOSTERONE  1.62 % GEL    Apply 2 pumps topically every morning.   TESTOSTERONE  CYPIONATE (DEPO-TESTOSTERONE ) 200 MG/ML INJECTION    Inject 1 mL (200 mg total) into the muscle every 10 days.   TESTOSTERONE  CYPIONATE (DEPOTESTOTERONE CYPIONATE) 100 MG/ML INJECTION    Inject 1.5 mLs (150 mg total) into the muscle every 10 days   TIRZEPATIDE  (MOUNJARO ) 10 MG/0.5ML PEN    Inject 10 mg into the skin once a week.   TIRZEPATIDE  (MOUNJARO ) 10 MG/0.5ML PEN    Inject 10 mg into the skin once a week.   TIRZEPATIDE  (MOUNJARO ) 10 MG/0.5ML PEN    Inject 10 mg into the skin once a week.   TIRZEPATIDE  (ZEPBOUND ) 10 MG/0.5ML PEN    Inject 10 mg into the skin once a week for severe sleep apnea.   TIRZEPATIDE  (ZEPBOUND ) 2.5 MG/0.5ML PEN    Inject 2.5 mg  into the skin once a week.   VITAMIN D , ERGOCALCIFEROL , (DRISDOL ) 1.25 MG (50000 UNIT) CAPS CAPSULE    Take 1 capsule (50,000 Units total) by mouth every 7 (seven) days.   VITAMIN D , ERGOCALCIFEROL , (DRISDOL ) 1.25 MG (50000 UNIT) CAPS CAPSULE    Take 1 capsule (50,000 Units total) by mouth once a week.   VITAMIN D , ERGOCALCIFEROL , (DRISDOL ) 1.25 MG (50000 UNIT) CAPS CAPSULE    Take 1 capsule (50,000 Units total) by mouth once a week.  Modified Medications   No medications on file  Discontinued Medications   No medications on file       05/05/2018   11:42 AM 05/05/2018    9:35 AM 02/14/2018    9:14 AM 01/17/2018    9:07 AM  [facility] HIV PREP FLOWSHEET RESULTS  Insurance Status  Insured Insured Insured  How did you hear?   Family Physician Referred from Dr Loreli  Gender at birth  Male Male Male  Gender identity  cis-Male  cis-Male  cis-Male   Risk for HIV >5 partners in past 6 mos (regardless of condom use);Condomless vaginal or anal intercourse   Condomless vaginal or anal intercourse;>5 partners in past 6 mos (regardless of condom use);Hx of STI  Condomless vaginal or anal intercourse   Sex Partners  Men only Men only Men only  # sex partners past 3-6 mos  4-6  4-6  1-3   Sex activity preferences  Insertive and receptive Insertive and receptive;Oral Insertive and receptive;Oral  Condom use  Yes Yes No  % condom use  90 95   Partners genders and ages   39 20-24;M 25-29;M 55-49 M 20-24;M 25-29;M 30-49  Treated for STI?  No Yes N/A  HIV symptoms?  N/A  N/A  N/A   PrEP Eligibility  CrCl >60 ml/min  HIV negative;CrCl >60 ml/min;Substantial risk for HIV  Substantial risk for HIV;HIV negative   Paper work received?   No No     Data saved with a previous flowsheet row definition    Labs:  SCr: Lab Results  Component Value Date   CREATININE 0.89 07/09/2023   CREATININE 0.77 09/27/2021   CREATININE 0.73 (L) 07/13/2020   CREATININE 0.82 02/14/2018   CREATININE 0.82 01/17/2018   HIV Lab  Results  Component Value Date   HIV NON-REACTIVE 04/20/2024   HIV NON-REACTIVE 10/08/2023   HIV NON-REACTIVE 07/09/2023   HIV NON-REACTIVE 04/03/2023   HIV NON-REACTIVE 01/02/2023  Hepatitis B Lab Results  Component Value Date   HEPBSAB REACTIVE (A) 04/12/2022   HEPBSAG NEGATIVE 09/07/2015   Hepatitis C Lab Results  Component Value Date   HEPCAB NON-REACTIVE 06/20/2021   Hepatitis A Lab Results  Component Value Date   HAV REACTIVE (A) 01/17/2018   RPR and STI Lab Results  Component Value Date   LABRPR NON-REACTIVE 04/20/2024   LABRPR NON-REACTIVE 01/20/2024   LABRPR NON-REACTIVE 10/08/2023   LABRPR NON-REACTIVE 07/09/2023   LABRPR NON-REACTIVE 04/03/2023    STI Results GC CT  04/20/2024 10:42 AM Negative    Negative    Negative  Positive    Negative    Negative   01/20/2024 11:32 AM Negative    Negative    Negative  Negative    Negative    Negative   10/08/2023  9:00 AM Negative    Negative    Negative  Negative    Negative    Negative   07/09/2023  9:15 AM Negative    Negative    Negative  Negative    Negative    Negative   04/03/2023  8:46 AM Negative    Negative    Negative  Negative    Negative    Negative   01/02/2023  9:00 AM Negative    Negative    Negative  Negative    Negative    Negative   10/08/2022  9:12 AM Negative    Negative    Negative  Negative    Negative    Negative   07/02/2022  8:54 AM Negative    Negative    Negative  Negative    Negative    Negative   01/03/2022  9:42 AM Negative    Negative    Negative  Negative    Negative    Negative   09/27/2021  8:51 AM Negative    Negative    Negative  Negative    Negative    Negative   06/20/2021  9:14 AM Negative    Negative    Negative  Negative    Negative    Negative   03/22/2021 11:49 AM Negative  Negative   12/13/2020  9:33 AM Negative    Negative    Negative  Negative    Negative    Negative   05/05/2018 12:00 AM Negative    Negative    Negative   Negative    Negative    Negative   02/14/2018 12:00 AM Negative  Negative   01/17/2018 12:00 AM Negative    **POSITIVE**    Negative  Negative    Negative    Negative   10/30/2016 12:00 AM Negative    Negative    Negative  Negative    Negative    Negative   03/08/2016 12:00 AM Negative  Negative   12/06/2015 12:00 AM Negative  Negative     Assessment: Alexander Solomon comes in today to follow up for PrEP. He was last seen in July and missed his October appointment. 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 Screened for acute HIV symptoms such as fatigue, muscle aches, rash, sore throat, lymphadenopathy, headache, night sweats, nausea/vomiting/diarrhea, and fever. Denies any symptoms. He ran out of medication several months ago but states that he hasn't been very active.   His rectal swab resulted positive for chlamydia at his July appointment. He took 7 days of doxycycline  with no issues. He  is interested in doxyPEP. Explained administration, dosing, and side effects. Will send that in when I send his Truvada  once his labs return.  Discussed that his Medicaid covers both Truvada  and Descovy . Explained risks and benefits with both, and he prefers to stay on Truvada  for now.  Labs: Last HIV ab was negative on 04/20/24; HIV ab today and STI testing.  Plan: - HIV ab, RPR, and urine/rectal/pharyngeal cytologies for GC/chlamydia  - Truvada  x 3 months if HIV negative  - DoxyPEP Rx to [facility]  - Follow up with me again on 12/14/24  Ocean Schildt L. Hayde Kilgour, PharmD, BCIDP, AAHIVP, CPP Clinical Pharmacist Practitioner - Infectious Diseases Clinical Pharmacist Lead - Specialty Pharmacy [facility] for Infectious Disease  "

## 2024-09-23 LAB — CYTOLOGY, (ORAL, ANAL, URETHRAL) ANCILLARY ONLY
Chlamydia: NEGATIVE
Chlamydia: NEGATIVE
Comment: NEGATIVE
Comment: NEGATIVE
Comment: NORMAL
Comment: NORMAL
Neisseria Gonorrhea: NEGATIVE
Neisseria Gonorrhea: NEGATIVE

## 2024-09-23 LAB — URINE CYTOLOGY ANCILLARY ONLY
Chlamydia: NEGATIVE
Comment: NEGATIVE
Comment: NORMAL
Neisseria Gonorrhea: NEGATIVE

## 2024-09-23 LAB — SYPHILIS: RPR W/REFLEX TO RPR TITER AND TREPONEMAL ANTIBODIES, TRADITIONAL SCREENING AND DIAGNOSIS ALGORITHM: RPR Ser Ql: NONREACTIVE

## 2024-09-23 LAB — HIV ANTIBODY (ROUTINE TESTING W REFLEX)
HIV 1&2 Ab, 4th Generation: NONREACTIVE
HIV FINAL INTERPRETATION: NEGATIVE

## 2024-09-25 ENCOUNTER — Other Ambulatory Visit (HOSPITAL_COMMUNITY): Payer: Self-pay

## 2024-09-25 ENCOUNTER — Other Ambulatory Visit: Payer: Self-pay | Admitting: Pharmacist

## 2024-09-25 ENCOUNTER — Other Ambulatory Visit: Payer: Self-pay

## 2024-09-25 DIAGNOSIS — Z79899 Other long term (current) drug therapy: Secondary | ICD-10-CM

## 2024-09-25 MED ORDER — DOXYCYCLINE HYCLATE 100 MG PO TABS
ORAL_TABLET | ORAL | 0 refills | Status: AC
  Filled 2024-09-25 – 2024-10-09 (×3): qty 30, 15d supply, fill #0

## 2024-09-25 MED ORDER — EMTRICITABINE-TENOFOVIR DF 200-300 MG PO TABS
1.0000 | ORAL_TABLET | Freq: Every day | ORAL | 0 refills | Status: AC
  Filled 2024-09-28 (×2): qty 90, 90d supply, fill #0

## 2024-09-28 ENCOUNTER — Other Ambulatory Visit: Payer: Self-pay | Admitting: Pharmacy Technician

## 2024-09-28 ENCOUNTER — Other Ambulatory Visit: Payer: Self-pay

## 2024-09-28 ENCOUNTER — Encounter: Payer: Self-pay | Admitting: Pharmacist

## 2024-09-28 NOTE — Progress Notes (Signed)
 Specialty Pharmacy Refill Coordination Note  Alexander Solomon is a 39 y.o. male contacted today regarding refills of specialty medication(s) Emtricitabine -Tenofovir  DF (TRUVADA )   Patient requested Delivery   Delivery date: 09/29/24   Verified address: [address]  [place]   Medication will be filled on: 09/28/24

## 2024-09-30 ENCOUNTER — Other Ambulatory Visit (HOSPITAL_COMMUNITY): Payer: Self-pay

## 2024-09-30 ENCOUNTER — Encounter (HOSPITAL_COMMUNITY): Payer: Self-pay

## 2024-10-01 ENCOUNTER — Other Ambulatory Visit: Payer: Self-pay

## 2024-10-05 ENCOUNTER — Other Ambulatory Visit (HOSPITAL_COMMUNITY): Payer: Self-pay

## 2024-10-06 ENCOUNTER — Other Ambulatory Visit (HOSPITAL_COMMUNITY): Payer: Self-pay

## 2024-10-06 ENCOUNTER — Other Ambulatory Visit: Payer: Self-pay

## 2024-10-09 ENCOUNTER — Other Ambulatory Visit (HOSPITAL_BASED_OUTPATIENT_CLINIC_OR_DEPARTMENT_OTHER): Payer: Self-pay

## 2024-10-09 ENCOUNTER — Other Ambulatory Visit (HOSPITAL_COMMUNITY): Payer: Self-pay

## 2024-10-09 ENCOUNTER — Other Ambulatory Visit: Payer: Self-pay

## 2024-10-09 MED ORDER — AMPHETAMINE SULFATE 10 MG PO TABS
ORAL_TABLET | ORAL | 0 refills | Status: AC
  Filled 2024-10-09: qty 90, 30d supply, fill #0

## 2024-10-12 ENCOUNTER — Other Ambulatory Visit (HOSPITAL_BASED_OUTPATIENT_CLINIC_OR_DEPARTMENT_OTHER): Payer: Self-pay

## 2024-10-12 ENCOUNTER — Other Ambulatory Visit: Payer: Self-pay

## 2024-10-12 ENCOUNTER — Other Ambulatory Visit (HOSPITAL_COMMUNITY): Payer: Self-pay

## 2024-10-13 ENCOUNTER — Other Ambulatory Visit (HOSPITAL_BASED_OUTPATIENT_CLINIC_OR_DEPARTMENT_OTHER): Payer: Self-pay

## 2024-10-13 MED ORDER — ESCITALOPRAM OXALATE 10 MG PO TABS
10.0000 mg | ORAL_TABLET | Freq: Every day | ORAL | 1 refills | Status: AC
  Filled 2024-10-13: qty 90, 90d supply, fill #0

## 2024-10-16 ENCOUNTER — Other Ambulatory Visit: Payer: Self-pay

## 2024-10-16 ENCOUNTER — Other Ambulatory Visit (HOSPITAL_BASED_OUTPATIENT_CLINIC_OR_DEPARTMENT_OTHER): Payer: Self-pay

## 2024-10-22 ENCOUNTER — Other Ambulatory Visit: Payer: Self-pay | Admitting: Pharmacist

## 2024-10-22 ENCOUNTER — Other Ambulatory Visit: Payer: Self-pay

## 2024-10-22 NOTE — Progress Notes (Signed)
 Specialty Pharmacy Ongoing Clinical Assessment Note  Alexander Solomon is a 39 y.o. male who is being followed by the specialty pharmacy service for RxSp HIV PrEP   Patient's specialty medication(s) reviewed today: Emtricitabine -Tenofovir  DF (TRUVADA )   Missed doses in the last 4 weeks: 0   Patient/Caregiver did not have any additional questions or concerns.   Therapeutic benefit summary: Patient is achieving benefit   Adverse events/side effects summary: No adverse events/side effects   Patient's therapy is appropriate to: Continue    Goals Addressed             This Visit's Progress    Comply with lab assessments   On track    Patient is on track. Patient will adhere to provider and/or lab appointments.      Improve or maintain quality of life   On track    Patient is on track. Patient will be monitored by provider to determine if a change in treatment plan is warranted.      Maintain optimal adherence to therapy   On track    Patient is on track. Patient will maintain adherence.         Follow up: 1 year.   Please see follow up counseling and visit from 09/22/24.   Alexander Solomon, PharmD, BCIDP, AAHIVP, CPP Infectious Diseases Clinical Pharmacist Practitioner Clinical Pharmacist Lead, Specialty Pharmacy [facility] for Infectious Disease

## 2024-12-14 ENCOUNTER — Ambulatory Visit: Payer: Self-pay | Admitting: Pharmacist
